# Patient Record
Sex: Female | Born: 2000 | Race: Black or African American | Hispanic: No | Marital: Single | Smoking: Never smoker
Health system: Southern US, Community
[De-identification: ages and names within clinical notes are randomized; demographics above are authoritative.]

## PROBLEM LIST (undated history)

## (undated) ENCOUNTER — Emergency Department (HOSPITAL_COMMUNITY): Admission: EM | Payer: Medicaid Other | Source: Home / Self Care

## (undated) DIAGNOSIS — Z889 Allergy status to unspecified drugs, medicaments and biological substances status: Secondary | ICD-10-CM

## (undated) DIAGNOSIS — F419 Anxiety disorder, unspecified: Secondary | ICD-10-CM

## (undated) DIAGNOSIS — F32A Depression, unspecified: Secondary | ICD-10-CM

## (undated) DIAGNOSIS — F329 Major depressive disorder, single episode, unspecified: Secondary | ICD-10-CM

## (undated) DIAGNOSIS — F99 Mental disorder, not otherwise specified: Secondary | ICD-10-CM

## (undated) HISTORY — DX: Mental disorder, not otherwise specified: F99

## (undated) HISTORY — DX: Anxiety disorder, unspecified: F41.9

## (undated) HISTORY — DX: Depression, unspecified: F32.A

## (undated) HISTORY — PX: NECK SURGERY: SHX720

## (undated) HISTORY — PX: LYMPHADENECTOMY: SHX15

---
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
## 2019-07-08 ENCOUNTER — Emergency Department (HOSPITAL_COMMUNITY)
Admission: EM | Admit: 2019-07-08 | Discharge: 2019-07-09 | Disposition: A | Discharge status: Home / Self Care | Payer: Medicaid Other | Attending: Emergency Medicine | Admitting: Emergency Medicine

## 2019-07-08 ENCOUNTER — Other Ambulatory Visit: Payer: Self-pay

## 2019-07-08 ENCOUNTER — Emergency Department (HOSPITAL_COMMUNITY): Payer: Medicaid Other

## 2019-07-08 ENCOUNTER — Encounter (HOSPITAL_COMMUNITY): Payer: Self-pay | Admitting: Student

## 2019-07-08 DIAGNOSIS — Y999 Unspecified external cause status: Secondary | ICD-10-CM | POA: Insufficient documentation

## 2019-07-08 DIAGNOSIS — Y939 Activity, unspecified: Secondary | ICD-10-CM | POA: Diagnosis not present

## 2019-07-08 DIAGNOSIS — M79674 Pain in right toe(s): Secondary | ICD-10-CM | POA: Insufficient documentation

## 2019-07-08 DIAGNOSIS — W2209XA Striking against other stationary object, initial encounter: Secondary | ICD-10-CM | POA: Diagnosis not present

## 2019-07-08 DIAGNOSIS — S99921A Unspecified injury of right foot, initial encounter: Secondary | ICD-10-CM

## 2019-07-08 DIAGNOSIS — Y929 Unspecified place or not applicable: Secondary | ICD-10-CM | POA: Insufficient documentation

## 2019-07-08 MED ORDER — NAPROXEN 500 MG PO TABS: 500.0000 mg | ORAL_TABLET | Freq: Two times a day (BID) | ORAL | 0 refills | Status: DC

## 2019-07-08 NOTE — Discharge Instructions (Addendum)
Please read and follow all provided instructions.  You have been seen today for a toe injury.   Tests performed today include: An x-ray of the affected area - does NOT show any broken bones or dislocations.  Vital signs. See below for your results today.   Home care instructions: -- *PRICE in the first 24-48 hours after injury: Protect (with brace, splint, sling), if given by your provider Rest Ice- Do not apply ice pack directly to your skin, place towel or similar between your skin and ice/ice pack. Apply ice for 20 min, then remove for 40 min while awake Compression- Wear brace, elastic bandage, splint as directed by your provider Elevate affected extremity above the level of your heart when not walking around for the first 24-48 hours   Medications:  - Naproxen is a nonsteroidal anti-inflammatory medication that will help with pain and swelling. Be sure to take this medication as prescribed with food, 1 pill every 12 hours,  It should be taken with food, as it can cause stomach upset, and more seriously, stomach bleeding. Do not take other nonsteroidal anti-inflammatory medications with this such as Advil, Motrin, Aleve, Mobic, Goodie Powder, or Motrin.    You make take Tylenol per over the counter dosing with these medications.   We have prescribed you new medication(s) today. Discuss the medications prescribed today with your pharmacist as they can have adverse effects and interactions with your other medicines including over the counter and prescribed medications. Seek medical evaluation if you start to experience new or abnormal symptoms after taking one of these medicines, seek care immediately if you start to experience difficulty breathing, feeling of your throat closing, facial swelling, or rash as these could be indications of a more serious allergic reaction   Follow-up instructions: Please follow-up with your primary care provider within 1 week for re-evaluation.   Return  instructions:  Please return if your digits or extremity are numb or tingling, appear gray or blue, or you have severe pain (also elevate the extremity and loosen splint or wrap if you were given one) Please return if you have redness or fevers.  Please return to the Emergency Department if you experience worsening symptoms.  Please return if you have any other emergent concerns. Additional Information:  Your vital signs today were: BP 96/61 (BP Location: Right Arm)    Pulse 88    Temp 99.2 F (37.3 C) (Oral)    Resp 16    LMP 07/04/2019    SpO2 100%  If your blood pressure (BP) was elevated above 135/85 this visit, please have this repeated by your doctor within one month. ---------------

## 2019-07-08 NOTE — ED Triage Notes (Signed)
Pt with 4th toe pain after stubbing it. Ambulatory with steady gait, unsure if it is swollen. NAD.

## 2019-07-08 NOTE — ED Provider Notes (Signed)
[facility] EMERGENCY DEPARTMENT Provider Note   CSN: [id number] Arrival date & time: 07/08/19  2112     History   Chief Complaint Chief Complaint  Patient presents with  . Toe Pain    HPI Latoya West is a 18 y.o. female with a hx of depression who presents to the ED w/ complaints of right 4th toe injury which occurred last night. Patient states that she stubbed the toe on a metal chair & has been having pain since. Worse with weightbearing, no alleviating factors. No intervention PTA. Denies numbness, tingling, weakness, or wounds. Denies chance of pregnancy.     HPI  Past Medical History:  Diagnosis Date  . Multiple allergies    has an epi pen for this  . Obesity, morbid [facility])     Patient Active Problem List   Diagnosis Date Noted  . Suicide attempt by drug ingestion (HCC) 07/08/2018  . 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 MDD (major depressive disorder) 07/07/2018  . Oppositional defiant disorder 11/05/2013    Past Surgical History:  Procedure Laterality Date  . LYMPHADENECTOMY    . NECK SURGERY       OB History    Gravida  0   Para  0   Term  0   Preterm  0   AB  0   Living  0     SAB  0   TAB  0   Ectopic  0   Multiple  0   Live Births  0            Home Medications    Prior to Admission medications   Medication Sig Start Date End Date Taking? Authorizing Provider  EPINEPHrine (EPIPEN 2-PAK) 0.3 mg/0.3 mL IJ SOAJ injection Inject 0.3 mg into the muscle once as needed (for anaphylactic or severe allergic reaction).    [provider]  ibuprofen (ADVIL,MOTRIN) 400 MG tablet Take 1 tablet (400 mg total) by mouth every 6 (six) hours as needed. Patient not taking: Reported on 07/06/2018 10/04/16   Everlene Farrier, PA-C  metroNIDAZOLE (FLAGYL) 500 MG tablet Take 1 tablet (500 mg total) by mouth 2 (two) times daily. 07/23/18   Leftwich-Kirby, Wilmer Floor, CNM   [facility]ico [phone] MCG/24HR transdermal patch Follow packet instructions 07/16/18   Hurshel Party, CNM    Family History No family history on file.  Social History Social History   Tobacco Use  . Smoking status: Never Smoker  . Smokeless tobacco: Never Used  Substance Use Topics  . Alcohol use: No  . Drug use: Yes    Types: Marijuana     Allergies   Kiwi extract   Review of Systems Review of Systems  Constitutional: Negative for chills and fever.  Musculoskeletal: Positive for arthralgias.  Skin: Negative for wound.  Neurological: Negative for weakness and numbness.    Physical Exam Updated Vital Signs BP 96/61 (BP Location: Right Arm)   Pulse 88   Temp 99.2 F (37.3 C) (Oral)   Resp 16   LMP 07/04/2019   SpO2 100%   Physical Exam Vitals signs and nursing note reviewed.  Constitutional:      General: She is not in acute distress.    Appearance: She is not ill-appearing or toxic-appearing.  HENT:     Head: Normocephalic and atraumatic.  Cardiovascular:     Pulses:          Dorsalis pedis pulses are 2+  on the right side and 2+ on the left side.       Posterior tibial pulses are 2+ on the right side and 2+ on the left side.  Pulmonary:     Effort: Pulmonary effort is normal.  Musculoskeletal:     Comments: Lower extremities: No obvious deformity, appreciable swelling, edema, erythema, ecchymosis, warmth, or open wounds. Patient has intact AROM to bilateral hips, knees, ankles, and all digits. Tender to palpation to the R 4th middle & distal phalanx. Otherwise nontender.   Skin:    General: Skin is warm and dry.     Capillary Refill: Capillary refill takes less than 2 seconds.  Neurological:     Mental Status: She is alert.     Comments: Alert. Clear speech. Sensation grossly intact to bilateral lower extremities. 5/5 strength with plantar/dorsiflexion bilaterally. Patient ambulatory with mildly antalgic gait, no foot drop noted.   Psychiatric:         Mood and Affect: Mood normal.        Behavior: Behavior normal.    ED Treatments / Results  Labs (all labs ordered are listed, but only abnormal results are displayed) Labs Reviewed - No data to display  EKG None  Radiology Dg Foot Complete Right  Result Date: 07/08/2019 CLINICAL DATA:  Right fourth toe pain after stubbing toe last night. Persistent pain. EXAM: RIGHT FOOT COMPLETE - 3+ VIEW COMPARISON:  None. FINDINGS: There is no evidence of fracture or dislocation. There is no evidence of arthropathy or other focal bone abnormality. Soft tissues are unremarkable. IMPRESSION: Negative radiographs of the right foot. Electronically Signed   By: Keith Rake M.D.   On: 07/08/2019 21:52    Procedures Procedures (including critical care time)  Medications Ordered in ED Medications - No data to display   Initial Impression / Assessment and Plan / ED Course  I have reviewed the triage vital signs and the nursing notes.  Pertinent labs & imaging results that were available during my care of the patient were reviewed by me and considered in my medical decision making (see chart for details).    Patient presents to the ED with complaints of pain to the  R 4th toe pain s/p injury last night. Exam without obvious deformity or open wounds. ROM intact. Tender to palpation to middle & distal phalanx. NVI distally. Xray negative for fracture/dislocation. Buddy taped. PRICE recommended. Naproxen prescribed. I discussed results, treatment plan, need for follow-up, and return precautions with the patient. Provided opportunity for questions, patient confirmed understanding and are in agreement with plan.     Final Clinical Impressions(s) / ED Diagnoses   Final diagnoses:  Injury of toe on right foot, initial encounter    ED Discharge Orders         Ordered    naproxen (NAPROSYN) 500 MG tablet  2 times daily     07/08/19 2321           Amaryllis Dyke, PA-C 07/08/19 2330     Little, Wenda Overland, MD 07/10/19 [phone]

## 2019-11-02 ENCOUNTER — Inpatient Hospital Stay (HOSPITAL_COMMUNITY)
Admission: AD | Admit: 2019-11-02 | Discharge: 2019-11-02 | Disposition: A | Discharge status: Home / Self Care | Payer: Medicaid Other | Attending: Obstetrics & Gynecology | Admitting: Obstetrics & Gynecology

## 2019-11-02 ENCOUNTER — Encounter (HOSPITAL_COMMUNITY): Payer: Self-pay | Admitting: Obstetrics & Gynecology

## 2019-11-02 ENCOUNTER — Other Ambulatory Visit: Payer: Self-pay

## 2019-11-02 DIAGNOSIS — O211 Hyperemesis gravidarum with metabolic disturbance: Secondary | ICD-10-CM | POA: Insufficient documentation

## 2019-11-02 DIAGNOSIS — Z3A01 Less than 8 weeks gestation of pregnancy: Secondary | ICD-10-CM | POA: Insufficient documentation

## 2019-11-02 DIAGNOSIS — O219 Vomiting of pregnancy, unspecified: Secondary | ICD-10-CM

## 2019-11-02 DIAGNOSIS — O99211 Obesity complicating pregnancy, first trimester: Secondary | ICD-10-CM | POA: Diagnosis not present

## 2019-11-02 DIAGNOSIS — O210 Mild hyperemesis gravidarum: Secondary | ICD-10-CM | POA: Diagnosis present

## 2019-11-02 LAB — CBC
HCT: 38.2 % (ref 36.0–46.0)
Hemoglobin: 12.6 g/dL (ref 12.0–15.0)
MCH: 28.7 pg (ref 26.0–34.0)
MCHC: 33 g/dL (ref 30.0–36.0)
MCV: 87 fL (ref 80.0–100.0)
Platelets: 264 10*3/uL (ref 150–400)
RBC: 4.39 MIL/uL (ref 3.87–5.11)
RDW: 13 % (ref 11.5–15.5)
WBC: 8.1 10*3/uL (ref 4.0–10.5)
nRBC: 0 % (ref 0.0–0.2)

## 2019-11-02 LAB — URINALYSIS, ROUTINE W REFLEX MICROSCOPIC
Bilirubin Urine: NEGATIVE
Glucose, UA: NEGATIVE mg/dL
Hgb urine dipstick: NEGATIVE
Ketones, ur: 5 mg/dL — AB
Nitrite: NEGATIVE
Protein, ur: 30 mg/dL — AB
Specific Gravity, Urine: 1.031 — ABNORMAL HIGH (ref 1.005–1.030)
pH: 5 (ref 5.0–8.0)

## 2019-11-02 LAB — POC URINE PREG, ED: Preg Test, Ur: POSITIVE — AB

## 2019-11-02 LAB — BASIC METABOLIC PANEL
Anion gap: 9 (ref 5–15)
BUN: 8 mg/dL (ref 6–20)
CO2: 23 mmol/L (ref 22–32)
Calcium: 9.1 mg/dL (ref 8.9–10.3)
Chloride: 101 mmol/L (ref 98–111)
Creatinine, Ser: 0.76 mg/dL (ref 0.44–1.00)
GFR calc Af Amer: >60 mL/min (ref >60)
GFR calc non Af Amer: >60 mL/min (ref >60)
Glucose, Bld: 85 mg/dL (ref 70–99)
Potassium: 4 mmol/L (ref 3.5–5.1)
Sodium: 133 mmol/L — ABNORMAL LOW (ref 135–145)

## 2019-11-02 MED ORDER — FAMOTIDINE IN NACL 20-0.9 MG/50ML-% IV SOLN
20.0000 mg | Freq: Once | INTRAVENOUS | Status: AC
  Administered 2019-11-02: 20 mg via INTRAVENOUS
  Filled 2019-11-02: qty 50

## 2019-11-02 MED ORDER — PYRIDOXINE HCL 25 MG PO TABS: 25.0000 mg | ORAL_TABLET | Freq: Three times a day (TID) | ORAL | 0 refills | Status: DC

## 2019-11-02 MED ORDER — PROMETHAZINE HCL 25 MG/ML IJ SOLN
25.0000 mg | Freq: Four times a day (QID) | INTRAMUSCULAR | Status: DC | PRN
  Administered 2019-11-02: 25 mg via INTRAVENOUS
  Filled 2019-11-02: qty 1

## 2019-11-02 MED ORDER — PROMETHAZINE HCL 25 MG PO TABS: 25.0000 mg | ORAL_TABLET | Freq: Four times a day (QID) | ORAL | 0 refills | Status: DC | PRN

## 2019-11-02 MED ORDER — DOXYLAMINE SUCCINATE (SLEEP) 25 MG PO TABS: 25.0000 mg | ORAL_TABLET | Freq: Three times a day (TID) | ORAL | 0 refills | Status: DC | PRN

## 2019-11-02 MED ORDER — LACTATED RINGERS IV BOLUS
1000.0000 mL | Freq: Once | INTRAVENOUS | Status: AC
  Administered 2019-11-02: 20:00:00 1000 mL via INTRAVENOUS

## 2019-11-02 NOTE — Discharge Instructions (Signed)
Morning Sickness ° °Morning sickness is when you feel sick to your stomach (nauseous) during pregnancy. You may feel sick to your stomach and throw up (vomit). You may feel sick in the morning, but you can feel this way at any time of day. Some women feel very sick to their stomach and cannot stop throwing up (hyperemesis gravidarum). °Follow these instructions at home: °Medicines °· Take over-the-counter and prescription medicines only as told by your doctor. Do not take any medicines until you talk with your doctor about them first. °· Taking multivitamins before getting pregnant can stop or lessen the harshness of morning sickness. °Eating and drinking °· Eat dry toast or crackers before getting out of bed. °· Eat 5 or 6 small meals a day. °· Eat dry and bland foods like rice and baked potatoes. °· Do not eat greasy, fatty, or spicy foods. °· Have someone cook for you if the smell of food causes you to feel sick or throw up. °· If you feel sick to your stomach after taking prenatal vitamins, take them at night or with a snack. °· Eat protein when you need a snack. Nuts, yogurt, and cheese are good choices. °· Drink fluids throughout the day. °· Try ginger ale made with real ginger, ginger tea made from fresh grated ginger, or ginger candies. °General instructions °· Do not use any products that have nicotine or tobacco in them, such as cigarettes and e-cigarettes. If you need help quitting, ask your doctor. °· Use an air purifier to keep the air in your house free of smells. °· Get lots of fresh air. °· Try to avoid smells that make you feel sick. °· Try: °? Wearing a bracelet that is used for seasickness (acupressure wristband). °? Going to a doctor who puts thin needles into certain body points (acupuncture) to improve how you feel. °Contact a doctor if: °· You need medicine to feel better. °· You feel dizzy or light-headed. °· You are losing weight. °Get help right away if: °· You feel very sick to your  stomach and cannot stop throwing up. °· You pass out (faint). °· You have very bad pain in your belly. °Summary °· Morning sickness is when you feel sick to your stomach (nauseous) during pregnancy. °· You may feel sick in the morning, but you can feel this way at any time of day. °· Making some changes to what you eat may help your symptoms go away. °This information is not intended to replace advice given to you by your health care provider. Make sure you discuss any questions you have with your health care provider. °Document Revised: 07/26/2017 Document Reviewed: 09/13/2016 °Elsevier Patient Education © 2020 [employer] ° °

## 2019-11-02 NOTE — ED Provider Notes (Addendum)
Patient placed in Quick Look pathway, seen and evaluated   Chief Complaint: Abdominal cramping, nausea, vomiting  HPI:   Patient presenting for evaluation of 1 week history of nausea and vomiting as well as lower abdominal cramping which she reports is similar to menstrual cramps.  She took a pregnancy test a few days ago which was positive.  Denies fever, chest pain, shortness of breath, diarrhea, constipation, vaginal itching, bleeding, or discharge.    ROS: +n/v, abdominal pain, -ve fever, chest pain, shortness of breath  Physical Exam:   Gen: No distress  Neuro: Awake and Alert  Skin: Warm    Focused Exam: Abdomen soft, nontender, active bowel sounds.  Lungs clear to auscultation bilaterally.  Heart regular rate and rhythm.  Spoke with Melanie at the MAU, plan to transfer to [facility] hospital for further evaluation and management.  Initiation of care has begun. The patient has been counseled on the process, plan, and necessity for staying for the completion/evaluation, and the remainder of the medical screening examination     Bennye Alm 11/02/19 1751    Maia Plan, MD 11/03/19 1124

## 2019-11-02 NOTE — MAU Provider Note (Signed)
  History     CSN: [id number]  Arrival date and time: 11/02/19 1645   First Provider Initiated Contact with Patient 11/02/19 1847      Chief Complaint  Patient presents with  . Morning Sickness   Latoya West is a 19 year old G1P0 at [redacted]w[redacted]d gestation by LMP who presents for one week of nausea and vomiting. Patient has felt nauseous for over a week by smells and foods. Patient is able to tolerate small amounts of food and liquids by mouth, but throws up if consumes more. Patient endorses a 10 lbs weight loss and "feels dehydrated" with little energy and a "spinny" feeling on standing. Nausea and vomiting is alleviated by sleep and ginger ale. Patient denies fever, diarrhea, sick contacts.    OB History    Gravida  1   Para  0   Term  0   Preterm  0   AB  0   Living  0     SAB  0   TAB  0   Ectopic  0   Multiple  0   Live Births  0           Past Medical History:  Diagnosis Date  . Multiple allergies    has an epi pen for this  . Obesity, morbid [facility])     Past Surgical History:  Procedure Laterality Date  . LYMPHADENECTOMY    . NECK SURGERY      History reviewed. No pertinent family history.  Social History   Tobacco Use  . Smoking status: Never Smoker  . Smokeless tobacco: Never Used  Substance Use Topics  . Alcohol use: No  . Drug use: Yes    Types: Marijuana    Allergies:  Allergies  Allergen Reactions  . Kiwi Extract Swelling and Rash    Medications Prior to Admission  Medication Sig Dispense Refill Last Dose  . EPINEPHrine (EPIPEN 2-PAK) 0.3 mg/0.3 mL IJ SOAJ injection Inject 0.3 mg into the muscle once as needed (for anaphylactic or severe allergic reaction).     . metroNIDAZOLE (FLAGYL) 500 MG tablet Take 1 tablet (500 mg total) by mouth 2 (two) times daily. 14 tablet 0   . naproxen (NAPROSYN) 500 MG tablet Take 1 tablet (500 mg total) by mouth 2 (two) times daily. 10 tablet 0   . XULANE 150-35 MCG/24HR transdermal patch Follow packet  instructions 3 patch 11     Review of Systems  Constitutional: Positive for appetite change, fatigue and unexpected weight change. Negative for fever.  Gastrointestinal: Positive for nausea and vomiting. Negative for abdominal pain, constipation and diarrhea.  Genitourinary: Negative for vaginal bleeding.   Physical Exam   Blood pressure 115/74, pulse 77, temperature 98.7 F (37.1 C), resp. rate 16, height 5\' 6"  (1.676 m), weight 97.5 kg, last menstrual period 09/19/2019, SpO2 100 %.  Physical Exam  Constitutional: She is oriented to person, place, and time. She appears well-developed and well-nourished.  HENT:  Head: Normocephalic.  Neurological: She is alert and oriented to person, place, and time.    MAU Course  Procedures  Urinalysis showed elevated specific gravity indicating dehydration. Promethazine ordered for nausea/vomiting and IV fluids for dehydration. CBC for WBCs to rule out infection. BMP for electrolyte status.  Assessment and Plan    09/21/2019 11/02/2019, 7:30 PM

## 2019-11-02 NOTE — MAU Provider Note (Addendum)
History     CSN: [id number]  Arrival date and time: 11/02/19 1645  19 y.o. G1 @[redacted]w[redacted]d  by LMP presenting with N/V. Sx started 1 week ago. She can tolerate very little. She was able to drink ginger ale today. Denies sick contacts. No fevers. Denies abd pain and VB.  OB History    Gravida  1   Para  0   Term  0   Preterm  0   AB  0   Living  0     SAB  0   TAB  0   Ectopic  0   Multiple  0   Live Births  0           Past Medical History:  Diagnosis Date  . Multiple allergies    has an epi pen for this  . Obesity, morbid [facility])     Past Surgical History:  Procedure Laterality Date  . LYMPHADENECTOMY    . NECK SURGERY      No family history on file.  Social History   Tobacco Use  . Smoking status: Never Smoker  . Smokeless tobacco: Never Used  Substance Use Topics  . Alcohol use: No  . Drug use: Yes    Types: Marijuana    Allergies:  Allergies  Allergen Reactions  . Kiwi Extract Swelling and Rash    Medications Prior to Admission  Medication Sig Dispense Refill Last Dose  . EPINEPHrine (EPIPEN 2-PAK) 0.3 mg/0.3 mL IJ SOAJ injection Inject 0.3 mg into the muscle once as needed (for anaphylactic or severe allergic reaction).     . metroNIDAZOLE (FLAGYL) 500 MG tablet Take 1 tablet (500 mg total) by mouth 2 (two) times daily. 14 tablet 0   . naproxen (NAPROSYN) 500 MG tablet Take 1 tablet (500 mg total) by mouth 2 (two) times daily. 10 tablet 0   . XULANE 150-35 MCG/24HR transdermal patch Follow packet instructions 3 patch 11     Review of Systems  Constitutional: Negative for chills and fever.  Gastrointestinal: Positive for nausea and vomiting. Negative for abdominal pain.  Genitourinary: Negative for vaginal bleeding.   Physical Exam   Blood pressure 118/89, pulse 92, temperature 99 F (37.2 C), temperature source Oral, resp. rate 16, last menstrual period 07/04/2019, SpO2 100 %.  Physical Exam  Nursing note and vitals  reviewed. Constitutional: She is oriented to person, place, and time. She appears well-developed and well-nourished. No distress.  HENT:  Head: Normocephalic and atraumatic.  Cardiovascular: Normal rate.  Respiratory: Effort normal. No respiratory distress.  Musculoskeletal:        General: Normal range of motion.     Cervical back: Normal range of motion.  Neurological: She is alert and oriented to person, place, and time.  Skin: Skin is warm and dry.  Psychiatric: She has a normal mood and affect.   Results for orders placed or performed during the hospital encounter of 11/02/19 (from the past 24 hour(s))  POC Urine Pregnancy, ED (not at [facility])     Status: Abnormal   Collection Time: 11/02/19  5:31 PM  Result Value Ref Range   Preg Test, Ur POSITIVE (A) NEGATIVE  Urinalysis, Routine w reflex microscopic     Status: Abnormal   Collection Time: 11/02/19  6:31 PM  Result Value Ref Range   Color, Urine YELLOW YELLOW   APPearance HAZY (A) CLEAR   Specific Gravity, Urine 1.031 (H) 1.005 - 1.030   pH 5.0 5.0 - 8.0  Glucose, UA NEGATIVE NEGATIVE mg/dL   Hgb urine dipstick NEGATIVE NEGATIVE   Bilirubin Urine NEGATIVE NEGATIVE   Ketones, ur 5 (A) NEGATIVE mg/dL   Protein, ur 30 (A) NEGATIVE mg/dL   Nitrite NEGATIVE NEGATIVE   Leukocytes,Ua TRACE (A) NEGATIVE   RBC / HPF 0-5 0 - 5 RBC/hpf   WBC, UA 0-5 0 - 5 WBC/hpf   Bacteria, UA RARE (A) NONE SEEN   Squamous Epithelial / LPF 6-10 0 - 5   Mucus PRESENT   CBC     Status: None   Collection Time: 11/02/19  7:36 PM  Result Value Ref Range   WBC 8.1 4.0 - 10.5 K/uL   RBC 4.39 3.87 - 5.11 MIL/uL   Hemoglobin 12.6 12.0 - 15.0 g/dL   HCT 47.4 25.9 - 56.3 %   MCV 87.0 80.0 - 100.0 fL   MCH 28.7 26.0 - 34.0 pg   MCHC 33.0 30.0 - 36.0 g/dL   RDW 87.5 64.3 - 32.9 %   Platelets 264 150 - 400 K/uL   nRBC 0.0 0.0 - 0.2 %  Basic metabolic panel     Status: Abnormal   Collection Time: 11/02/19  7:36 PM  Result Value Ref Range   Sodium 133  (L) 135 - 145 mmol/L   Potassium 4.0 3.5 - 5.1 mmol/L   Chloride 101 98 - 111 mmol/L   CO2 23 22 - 32 mmol/L   Glucose, Bld 85 70 - 99 mg/dL   BUN 8 6 - 20 mg/dL   Creatinine, Ser 5.18 0.44 - 1.00 mg/dL   Calcium 9.1 8.9 - 84.1 mg/dL   GFR calc non Af Amer >60 >60 mL/min   GFR calc Af Amer >60 >60 mL/min   Anion gap 9 5 - 15   MAU Course  Procedures Meds ordered this encounter  Medications  . lactated ringers bolus 1,000 mL  . promethazine (PHENERGAN) injection 25 mg  . famotidine (PEPCID) IVPB 20 mg premix  . pyridOXINE (VITAMIN B-6) 25 MG tablet    Sig: Take 1 tablet (25 mg total) by mouth every 8 (eight) hours.    Dispense:  30 tablet    Refill:  0    Order Specific Question:   Supervising Provider    Answer:   Despina Hidden, LUTHER H [[id number]]  . doxylamine, Sleep, (UNISOM) 25 MG tablet    Sig: Take 1 tablet (25 mg total) by mouth every 8 (eight) hours as needed.    Dispense:  30 tablet    Refill:  0    Order Specific Question:   Supervising Provider    Answer:   Despina Hidden, LUTHER H [[id number]]  . promethazine (PHENERGAN) 25 MG tablet    Sig: Take 1 tablet (25 mg total) by mouth every 6 (six) hours as needed for nausea or vomiting. For nausea and vomiting not relieved by B6 and Unisom    Dispense:  30 tablet    Refill:  0    Order Specific Question:   Supervising Provider    Answer:   Lazaro Arms [[id number]]   MDM Labs ordered. Transfer of care given to Dorna Bloom, [place]  11/02/2019 8:32 PM   --Patient tolerating PO after completion of IV medications. --Discussed diet change as primary intervention, B6+ Unisom as secondary, Phenergan for breakthrough vomiting Patient Vitals for the past 24 hrs:  BP Temp Temp src Pulse Resp SpO2 Height Weight  11/02/19 2143 110/61 -- -- 80 -- -- -- --  11/02/19 1841 115/74 98.7 F (37.1 C) -- 77 16 100 % 5\' 6"  (1.676 m) 97.5 kg  11/02/19 1721 118/89 99 F (37.2 C) Oral 92 16 100 % -- --   Assessment and Plan  --19 y.o. G1P0000 at  [redacted]w[redacted]d  --Nausea and vomiting in first trimester --Tolerating PO prior to discharge --Discharge home in stable condition  [redacted]w[redacted]d, MSN, CNM Certified Nurse Midwife, Faculty Practice 11/02/19 9:52 PM

## 2019-11-02 NOTE — MAU Note (Signed)
.   Latoya West is a 19 y.o. at [redacted]w[redacted]d here in MAU reporting: nausea and vomiting for a week and states she is not able to keep anything down. Denies pain or VB LMP: 09/19/19 Onset of complaint: ongoing for a week Pain score: 0 Vitals:   11/02/19 1721 11/02/19 1841  BP: 118/89 115/74  Pulse: 92 77  Resp: 16 16  Temp: 99 F (37.2 C) 98.7 F (37.1 C)  SpO2: 100% 100%     FHT Lab orders placed from triage: UA

## 2019-11-18 ENCOUNTER — Encounter: Payer: Self-pay | Admitting: General Practice

## 2019-12-09 ENCOUNTER — Ambulatory Visit (INDEPENDENT_AMBULATORY_CARE_PROVIDER_SITE_OTHER): Payer: Medicaid Other

## 2019-12-09 DIAGNOSIS — Z3401 Encounter for supervision of normal first pregnancy, first trimester: Secondary | ICD-10-CM

## 2019-12-09 NOTE — Progress Notes (Signed)
Virtual Visit via Video Note  I connected with Latoya West on 12/09/19 at  2:00 PM EDT by a video enabled telemedicine application and verified that I am speaking with the correct person using two identifiers.  I discussed the limitations of evaluation and management by telemedicine and the availability of in person appointments. The patient expressed understanding and agreed to proceed.  History of Present Illness:    OB History  Gravida Para Term Preterm AB Living  1 0 0 0 0 0  SAB TAB Ectopic Multiple Live Births  0 0 0 0 0    # Outcome Date GA Lbr Len/2nd Weight Sex Delivery Anes PTL Lv  1 Current             Assessment and Plan:  Normal Pregnancy   Follow Up Instructions:  12/21/19    I discussed the assessment and treatment plan with the patient. The patient was provided an opportunity to ask questions and all were answered. The patient agreed with the plan and demonstrated an understanding of the instructions.   The patient was advised to call back or seek an in-person evaluation if the symptoms worsen or if the condition fails to improve as anticipated.  I provided 10 minutes of non-face-to-face time during this encounter.   Thom Chimes, CMA

## 2019-12-10 NOTE — Progress Notes (Signed)
Patient ID: Latoya West, female   DOB: 05/14/2001, 19 y.o.   MRN: [id number] Patient seen and assessed by nursing staff during this encounter. I have reviewed the chart and agree with the documentation and plan. I have also made any necessary editorial changes.  Scheryl Darter, MD 12/10/2019 9:25 AM

## 2019-12-21 ENCOUNTER — Other Ambulatory Visit (HOSPITAL_COMMUNITY)
Admission: RE | Admit: 2019-12-21 | Discharge: 2019-12-21 | Disposition: A | Discharge status: Home / Self Care | Payer: Medicaid Other | Source: Ambulatory Visit | Attending: Advanced Practice Midwife | Admitting: Advanced Practice Midwife

## 2019-12-21 ENCOUNTER — Encounter: Payer: Self-pay | Admitting: Advanced Practice Midwife

## 2019-12-21 ENCOUNTER — Other Ambulatory Visit: Payer: Self-pay

## 2019-12-21 ENCOUNTER — Ambulatory Visit (INDEPENDENT_AMBULATORY_CARE_PROVIDER_SITE_OTHER): Payer: Medicaid Other | Admitting: Advanced Practice Midwife

## 2019-12-21 VITALS — BP 104/71 | HR 67 | Wt 220.8 lb

## 2019-12-21 DIAGNOSIS — O219 Vomiting of pregnancy, unspecified: Secondary | ICD-10-CM

## 2019-12-21 DIAGNOSIS — Z3402 Encounter for supervision of normal first pregnancy, second trimester: Secondary | ICD-10-CM | POA: Diagnosis not present

## 2019-12-21 DIAGNOSIS — Z3401 Encounter for supervision of normal first pregnancy, first trimester: Secondary | ICD-10-CM

## 2019-12-21 DIAGNOSIS — Z3A13 13 weeks gestation of pregnancy: Secondary | ICD-10-CM

## 2019-12-21 DIAGNOSIS — Z6841 Body Mass Index (BMI) 40.0 and over, adult: Secondary | ICD-10-CM | POA: Insufficient documentation

## 2019-12-21 DIAGNOSIS — O99211 Obesity complicating pregnancy, first trimester: Secondary | ICD-10-CM

## 2019-12-21 DIAGNOSIS — Z8659 Personal history of other mental and behavioral disorders: Secondary | ICD-10-CM

## 2019-12-21 MED ORDER — ASPIRIN EC 81 MG PO TBEC: 81.0000 mg | DELAYED_RELEASE_TABLET | Freq: Every day | ORAL | 5 refills | Status: DC

## 2019-12-21 NOTE — Progress Notes (Addendum)
Subjective:   Latoya West is a 19 y.o. G1P0000 at 43w2dby LMP being seen today for her first obstetrical visit.  Her obstetrical history is significant for obesity, Chlamydia in 2019, and has Oppositional defiant disorder; MDD (major depressive disorder); Suicide attempt by drug ingestion ([facility]; MDD (major depressive disorder), recurrent severe, without psychosis ([place]; Encounter for supervision of normal first pregnancy in first trimester; and Obesity affecting pregnancy in first trimester on their problem list.. Patient does intend to breast feed. Pregnancy history fully reviewed.  Patient reports no complaints. Denies Vaginal bleeding, contractions, LOF, nausea and vomiting today.   HISTORY: OB History  Gravida Para Term Preterm AB Living  1 0 0 0 0 0  SAB TAB Ectopic Multiple Live Births  0 0 0 0 0    # Outcome Date GA Lbr Len/2nd Weight Sex Delivery Anes PTL Lv  1 Current            Past Medical History:  Diagnosis Date  . Mental disorder   . Multiple allergies    has an epi pen for this  . Obesity, morbid ([facility]    Past Surgical History:  Procedure Laterality Date  . LYMPHADENECTOMY    . NECK SURGERY     Family History  Problem Relation Age of Onset  . Arthritis Mother    Social History   Tobacco Use  . Smoking status: Never Smoker  . Smokeless tobacco: Never Used  Substance Use Topics  . Alcohol use: No  . Drug use: Yes    Types: Marijuana   Allergies  Allergen Reactions  . Kiwi Extract Swelling and Rash   Current Outpatient Medications on File Prior to Visit  Medication Sig Dispense Refill  . Prenatal Vit-Fe Fumarate-FA (MULTIVITAMIN-PRENATAL) 27-0.8 MG TABS tablet Take 1 tablet by mouth daily at 12 noon.    .Marland Kitchendoxylamine, Sleep, (UNISOM) 25 MG tablet Take 1 tablet (25 mg total) by mouth every 8 (eight) hours as needed. (Patient not taking: Reported on 12/21/2019) 30 tablet 0  . EPINEPHrine (EPIPEN 2-PAK) 0.3 mg/0.3 mL IJ SOAJ injection Inject 0.3  mg into the muscle once as needed (for anaphylactic or severe allergic reaction).    . promethazine (PHENERGAN) 25 MG tablet Take 1 tablet (25 mg total) by mouth every 6 (six) hours as needed for nausea or vomiting. For nausea and vomiting not relieved by B6 and Unisom (Patient not taking: Reported on 12/21/2019) 30 tablet 0  . pyridOXINE (VITAMIN B-6) 25 MG tablet Take 1 tablet (25 mg total) by mouth every 8 (eight) hours. (Patient not taking: Reported on 12/21/2019) 30 tablet 0   No current facility-administered medications on file prior to visit.    Indications for ASA therapy (per uptodate) One of the following: Previous pregnancy with preeclampsia, especially early onset and with an adverse outcome No Multifetal gestation No Chronic hypertension No Type 1 or 2 diabetes mellitus No Chronic kidney disease No Autoimmune disease (antiphospholipid syndrome, systemic lupus erythematosus) No  Two or more of the following: Nulliparity Yes Obesity (body mass index >30 kg/m2) Yes Family history of preeclampsia in mother or sister No Age ?35 years No Sociodemographic characteristics (African American race, low socioeconomic level) Yes Personal risk factors (eg, previous pregnancy with low birth weight or small for gestational age infant, previous adverse pregnancy outcome [eg, stillbirth], interval >10 years between pregnancies) No  Indications for early 1 hour GTT (per uptodate)  BMI >25 (>23 in Asian women) AND one of  the following  Gestational diabetes mellitus in a previous pregnancy No Glycated hemoglobin ?5.7 percent (39 mmol/mol), impaired glucose tolerance, or impaired fasting glucose on previous testing No First-degree relative with diabetes No High-risk race/ethnicity (eg, African American, Latino, Native American, [place] American, Pacific Islander) Yes History of cardiovascular disease No Hypertension or on therapy for hypertension No High-density lipoprotein cholesterol level <35  mg/dL (0.90 mmol/L) and/or a triglyceride level >250 mg/dL (2.82 mmol/L) No Polycystic ovary syndrome No Physical inactivity Yes Other clinical condition associated with insulin resistance (eg, severe obesity, acanthosis nigricans) No Previous birth of an infant weighing ?4000 g No Previous stillbirth of unknown cause No Exam   Vitals:   12/21/19 1020  BP: 104/71  Pulse: 67  Weight: 220 lb 12.8 oz (100.2 kg)   Fetal Heart Rate (bpm): 140  Uterus:     Pelvic Exam: Perineum: no hemorrhoids, normal perineum   Vulva: normal external genitalia, no lesions   Vagina:  normal mucosa, normal discharge   Cervix: no lesions and normal, pap smear done.    Adnexa: normal adnexa and no mass, fullness, tenderness   Bony Pelvis: average  System: General: well-developed, well-nourished female in no acute distress   Breast:  Deferred    Skin: normal coloration and turgor, no rashes   Neurologic: oriented, normal, negative, normal mood   Extremities: normal strength, tone, and muscle mass, ROM of all joints is normal   HEENT PERRLA, extraocular movement intact and sclera clear, anicteric   Mouth/Teeth mucous membranes moist, pharynx normal without lesions and dental hygiene good   Neck supple and no masses   Cardiovascular: regular rate and rhythm   Respiratory:  no respiratory distress, normal breath sounds   Abdomen: soft, non-tender; bowel sounds normal; no masses,  no organomegaly     Assessment:   Pregnancy: G1P0000 Patient Active Problem List   Diagnosis Date Noted  . Obesity affecting pregnancy in first trimester 12/21/2019  . Encounter for supervision of normal first pregnancy in first trimester 12/09/2019  . Suicide attempt by drug ingestion ([place]) 07/08/2018  . MDD (major depressive disorder), recurrent severe, without psychosis ([place]) 07/08/2018  . MDD (major depressive disorder) 07/07/2018  . Oppositional defiant disorder 11/05/2013     Plan:  1. Encounter for supervision of  normal first pregnancy in first trimester - FHT was normal - Pt is excited about pregnancy ("I can't stop smiling after hearing the heartbeat") - Pt reports feeling safe at home and having some support (support person  = mom)    2. Obesity affecting pregnancy in first trimester - Discussed with Pt appropriate wt gain during pregnancy.  - A1C tested - Rx for ASA, start today  3. History of depression - Pt seen by Seth Bake with [facility]  Initial labs drawn. Continue prenatal vitamins. Discussed and offered genetic screening options, including Quad screen/AFP, NIPS testing, and option to decline testing. Benefits/risks/alternatives reviewed. Pt aware that anatomy US is form of genetic screening with lower accuracy in detecting trisomies than blood work.  Pt chooses/declines genetic screening today. declines: declined. Ultrasound discussed; fetal anatomic survey: deferred. Problem list reviewed and updated. The nature of [place] with multiple MDs and other Advanced Practice Providers was explained to patient; also emphasized that residents, students are part of our team. Routine obstetric precautions reviewed. No follow-ups on file.   Shawna Orleans, MS3 12/21/19 11:45 AM   CNM attestation:  I have seen and examined this patient and was present for  all history taking and examination.  I agree with above documentation in the medical student's note.   Latoya West is a 19 y.o. G1P0000 in the [place] office for initial prenatal visit for low risk pregnancy. See problem list below. +FM, denies LOF, VB, contractions, vaginal discharge.  Patient Active Problem List   Diagnosis Date Noted  . Obesity affecting pregnancy in first trimester 12/21/2019  . Encounter for supervision of normal first pregnancy in first trimester 12/09/2019  . Suicide attempt by drug ingestion ([place]) 07/08/2018  . MDD (major depressive disorder), recurrent severe,  without psychosis ([place]) 07/08/2018  . MDD (major depressive disorder) 07/07/2018  . Oppositional defiant disorder 11/05/2013     ROS, labs, PMH reviewed  PE: BP 104/71   Pulse 67   Wt 220 lb 12.8 oz (100.2 kg)   LMP 09/19/2019   BMI 35.64 kg/m  Gen: calm comfortable, well appearing Resp: normal effort, no distress Abd: gravid appropriate for gestational age  Fundal height: FHT by doppler:  Assessment/Plan: -    1. Encounter for supervision of normal first pregnancy in first trimester --Anticipatory guidance about next visits/weeks of pregnancy given. --Next visit in 4 weeks in office. Will discuss genetic screening again at that time as pt is unsure, declines today.    - Obstetric Panel, Including HIV - Culture, OB Urine - Hepatitis C Antibody - Babyscripts Schedule Optimization - Hemoglobin A1c - Cervicovaginal ancillary only( [place]) - [place] MFM OB COMP + 14 WK; Future  2. Obesity affecting pregnancy in first trimester --BMI 34 so may not need serial [place] for growth, will consider if unable to palpate fundal height appropriately.  Discussed weight gain recommendations. - aspirin EC 81 MG tablet; Take 1 tablet (81 mg total) by mouth daily.  Dispense: 30 tablet; Refill: 5 - Referral to Nutrition and Diabetes Services - Comp Met (CMET) - Protein / creatinine ratio, urine  3. History of depression --Seth Bake saw pt in office today and will follow up.  Pt doing well and bonding appropriately with pregnancy.  - Ambulatory referral to [place]   4. Nausea and vomiting during pregnancy prior to [redacted] weeks gestation --Improved now, still has Phenergan Rx for PRN.  Fatima Blank, CNM 12:01 PM

## 2019-12-21 NOTE — Progress Notes (Signed)
NOB in office, reports some pressure. NOB intake completed on 12-09-19. Declined genetic testing.

## 2019-12-22 ENCOUNTER — Other Ambulatory Visit: Payer: Self-pay

## 2019-12-22 LAB — OBSTETRIC PANEL, INCLUDING HIV
Antibody Screen: NEGATIVE
Basophils Absolute: 0 10*3/uL (ref 0.0–0.2)
Basos: 0 %
EOS (ABSOLUTE): 0.1 10*3/uL (ref 0.0–0.4)
Eos: 1 %
HIV Screen 4th Generation wRfx: NONREACTIVE
Hematocrit: 34.8 % (ref 34.0–46.6)
Hemoglobin: 11.7 g/dL (ref 11.1–15.9)
Hepatitis B Surface Ag: NEGATIVE
Immature Grans (Abs): 0 10*3/uL (ref 0.0–0.1)
Immature Granulocytes: 0 %
Lymphocytes Absolute: 2.9 10*3/uL (ref 0.7–3.1)
Lymphs: 36 %
MCH: 28.8 pg (ref 26.6–33.0)
MCHC: 33.6 g/dL (ref 31.5–35.7)
MCV: 86 fL (ref 79–97)
Monocytes Absolute: 0.4 10*3/uL (ref 0.1–0.9)
Monocytes: 6 %
Neutrophils Absolute: 4.6 10*3/uL (ref 1.4–7.0)
Neutrophils: 57 %
Platelets: 225 10*3/uL (ref 150–450)
RBC: 4.06 x10E6/uL (ref 3.77–5.28)
RDW: 12.2 % (ref 11.7–15.4)
RPR Ser Ql: NONREACTIVE
Rh Factor: POSITIVE
Rubella Antibodies, IGG: 2.73 index (ref >0.99)
WBC: 8 10*3/uL (ref 3.4–10.8)

## 2019-12-22 LAB — HEPATITIS C ANTIBODY: Hep C Virus Ab: <0.1 s/co ratio (ref 0.0–0.9)

## 2019-12-22 LAB — COMPREHENSIVE METABOLIC PANEL
ALT: 8 IU/L (ref 0–32)
AST: 12 IU/L (ref 0–40)
Albumin/Globulin Ratio: 1.6 (ref 1.2–2.2)
Albumin: 4.1 g/dL (ref 3.9–5.0)
Alkaline Phosphatase: 44 IU/L (ref 39–117)
BUN/Creatinine Ratio: 12 (ref 9–23)
BUN: 7 mg/dL (ref 6–20)
Bilirubin Total: <0.2 mg/dL (ref 0.0–1.2)
CO2: 21 mmol/L (ref 20–29)
Calcium: 9 mg/dL (ref 8.7–10.2)
Chloride: 102 mmol/L (ref 96–106)
Creatinine, Ser: 0.58 mg/dL (ref 0.57–1.00)
GFR calc Af Amer: 155 mL/min/{1.73_m2} (ref >59)
GFR calc non Af Amer: 134 mL/min/{1.73_m2} (ref >59)
Globulin, Total: 2.6 g/dL (ref 1.5–4.5)
Glucose: 73 mg/dL (ref 65–99)
Potassium: 3.8 mmol/L (ref 3.5–5.2)
Sodium: 135 mmol/L (ref 134–144)
Total Protein: 6.7 g/dL (ref 6.0–8.5)

## 2019-12-22 LAB — CERVICOVAGINAL ANCILLARY ONLY
Chlamydia: POSITIVE — AB
Comment: NEGATIVE
Comment: NEGATIVE
Comment: NORMAL
Neisseria Gonorrhea: NEGATIVE
Trichomonas: NEGATIVE

## 2019-12-22 LAB — PROTEIN / CREATININE RATIO, URINE
Creatinine, Urine: 37.3 mg/dL
Protein, Ur: 5.8 mg/dL
Protein/Creat Ratio: 155 mg/g creat (ref 0–200)

## 2019-12-22 LAB — HEMOGLOBIN A1C
Est. average glucose Bld gHb Est-mCnc: 97 mg/dL
Hgb A1c MFr Bld: 5 % (ref 4.8–5.6)

## 2019-12-22 MED ORDER — BLOOD PRESSURE KIT: 1.0000 | PACK | Freq: Once | 0 refills | Status: AC

## 2019-12-23 ENCOUNTER — Other Ambulatory Visit: Payer: Self-pay

## 2019-12-23 LAB — URINE CULTURE, OB REFLEX

## 2019-12-23 LAB — CULTURE, OB URINE

## 2019-12-23 MED ORDER — BLOOD PRESSURE KIT: 1.0000 | PACK | Freq: Once | 0 refills | Status: AC

## 2019-12-23 NOTE — Progress Notes (Signed)
Blood

## 2019-12-24 ENCOUNTER — Other Ambulatory Visit: Payer: Self-pay

## 2019-12-24 ENCOUNTER — Other Ambulatory Visit: Payer: Self-pay | Admitting: Advanced Practice Midwife

## 2019-12-24 MED ORDER — DOXYCYCLINE HYCLATE 100 MG PO TABS: 100.0000 mg | ORAL_TABLET | Freq: Two times a day (BID) | ORAL | 0 refills | Status: DC

## 2019-12-24 MED ORDER — AZITHROMYCIN 500 MG PO TABS: 1000.0000 mg | ORAL_TABLET | Freq: Once | ORAL | 1 refills | Status: AC

## 2019-12-29 ENCOUNTER — Ambulatory Visit (INDEPENDENT_AMBULATORY_CARE_PROVIDER_SITE_OTHER): Payer: Medicaid Other | Admitting: Licensed Clinical Social Worker

## 2019-12-29 DIAGNOSIS — Z3A00 Weeks of gestation of pregnancy not specified: Secondary | ICD-10-CM | POA: Diagnosis not present

## 2019-12-29 DIAGNOSIS — F329 Major depressive disorder, single episode, unspecified: Secondary | ICD-10-CM

## 2019-12-29 DIAGNOSIS — O99340 Other mental disorders complicating pregnancy, unspecified trimester: Secondary | ICD-10-CM | POA: Diagnosis not present

## 2019-12-29 NOTE — BH Specialist Note (Signed)
Integrated Behavioral Health via Telemedicine Video Visit  12/29/2019 Latoya West [id number]  Number of Integrated Behavioral Health visits: 1 Session Start time: 1:02pm  Session End time: 1:39pm Total time: 37 mins   Referring Provider: Sharen Counter CNM Type of Visit: Video Patient/Family location: Home  [facility] Provider location: [facility] All persons participating in visit: Pt and LCSWA A. Jakyren Fluegge   Confirmed patient's address: yes  Confirmed patient's phone number: yes  Any changes to demographics: no   Confirmed patient's insurance: no Any changes to patient's insurance: no   Discussed confidentiality: yes   I connected with Latoya West and/or Latoya West's n/a by a video enabled telemedicine application and verified that I am speaking with the correct person using two identifiers.     I discussed the limitations of evaluation and management by telemedicine and the availability of in person appointments.  I discussed that the purpose of this visit is to provide behavioral health care while limiting exposure to the novel coronavirus.   Discussed there is a possibility of technology failure and discussed alternative modes of communication if that failure occurs.  I discussed that engaging in this video visit, they consent to the provision of behavioral healthcare and the services will be billed under their insurance.  Patient and/or legal guardian expressed understanding and consented to video visit: yes   PRESENTING CONCERNS: Patient and/or family reports the following symptoms/concerns:  History of depression Duration of problem: Since 2017 ; Severity of problem: mild  STRENGTHS (Protective Factors/Coping Skills): Latoya West reports a strong desire to become more independent.  GOALS ADDRESSED: Patient will: 1.  Reduce symptoms of: overwhelmed, depressed mood 2.  Increase knowledge of diagnosis and interventions  3.  Demonstrate ability to: self  manage symptoms   INTERVENTIONS: Interventions utilized:  Case management and supportive counseling Standardized Assessments completed: phq9 completed 12/21/2019  ASSESSMENT: Patient currently experiencing depression affecting pregnancy   Patient may benefit from integrated behavioral health.   PLAN: 1. Follow up with behavioral health clinician on : may 26 2. Behavioral recommendations: increase social interaction, prioritize  3. Referral(s): None  I discussed the assessment and treatment plan with the patient and/or parent/guardian. They were provided an opportunity to ask questions and all were answered. They agreed with the plan and demonstrated an understanding of the instructions.   They were advised to call back or seek an in-person evaluation if the symptoms worsen or if the condition fails to improve as anticipated.  Gwyndolyn Saxon

## 2020-01-14 ENCOUNTER — Other Ambulatory Visit: Payer: Self-pay

## 2020-01-14 ENCOUNTER — Encounter: Payer: Medicaid Other | Attending: Advanced Practice Midwife | Admitting: Skilled Nursing Facility1

## 2020-01-14 ENCOUNTER — Encounter: Payer: Self-pay | Admitting: Skilled Nursing Facility1

## 2020-01-14 DIAGNOSIS — O99211 Obesity complicating pregnancy, first trimester: Secondary | ICD-10-CM | POA: Insufficient documentation

## 2020-01-14 NOTE — Progress Notes (Signed)
  Assessment:  Primary concerns today: obesity in preganacy.   Pt states she is about [redacted] weeks along.  Pt states her prepregnancy wt 212 and 215 pounds.  Pt states she feels she does well with dealing with her anxiety/depression.  Pt states she feels constipated: bowel movement once a day. Pt states she does not have any reflux or chest pressure or burning but water makes her stomach burn. Pt states she does deal with stomach bloat. Pt reports no emesis. Pt states she has a puppy she takes care of. Pt states she takes a 2+ hour nap daily (sleeping more often throughout the day). Pt states she snacks throughout the day. Pt states she gets very tired immediately after she eats. Pt states she lives with her mother and states finances are tough and her mother grocery shops but are not foods the pt likes and are mostly frozen and canned.    MEDICATIONS: none   DIETARY INTAKE:  Usual eating pattern includes 1 meals and multiple snacks per day.  Everyday foods include pineapple.  Avoided foods include none stated.    24-hr recall:  B (10 AM): whole can of pineapple or french toast sticks or 2 min chicken biscuits Snk ( AM):  L ( 1-3PM): yogurt or pineapple Snk ( PM): fruit D ( PM): frozen meal Snk ( PM): pickles Beverages: water, orange juice, gingerale   Usual physical activity: ADL's  Estimated energy needs: 1800-2000 calories  Handouts given: Detailed MyPlate Shopping at [employer] tree    Intervention:  Nutrition counseling. Educated pt on healthy diet within the context of healthy weight in pregnancy.  Goals: [phone]: [facility]  Try Alkaline water pH 8.8 Do not lay down within 2 hours of eating Do not drink orange juice Breakfast: 4 [place] toast sticks + [place] sausage or mini chicken biscuits or 1 cup of cereal with half banana or egg and cheese english muffin with spinach on it Snacks: any vegetable or any fruit (1/2 cup): not packed in syrup or yogurt or any nut or  cheese stick Be sure to always have non starchy vegetables with every lunch and every dinner 7 days a week Aim for 70 ounces of water per day (alkaline water) Aim to eat every 3-5 hours; create 3 meals a day   Teaching Method Utilized: Visual Auditory Hands on  Handouts given during visit include:  Detailed MyPlate  Barriers to learning/adherence to lifestyle change: finances   Demonstrated degree of understanding via:  Teach Back   Monitoring/Evaluation:  Dietary intake, exercise, and body weight prn.

## 2020-01-18 ENCOUNTER — Encounter: Payer: Self-pay | Admitting: Obstetrics

## 2020-01-18 ENCOUNTER — Ambulatory Visit (INDEPENDENT_AMBULATORY_CARE_PROVIDER_SITE_OTHER): Payer: Medicaid Other | Admitting: Obstetrics

## 2020-01-18 ENCOUNTER — Other Ambulatory Visit: Payer: Self-pay

## 2020-01-18 ENCOUNTER — Ambulatory Visit (INDEPENDENT_AMBULATORY_CARE_PROVIDER_SITE_OTHER): Payer: Medicaid Other | Admitting: Licensed Clinical Social Worker

## 2020-01-18 VITALS — BP 96/65 | HR 79 | Wt 230.9 lb

## 2020-01-18 DIAGNOSIS — O99342 Other mental disorders complicating pregnancy, second trimester: Secondary | ICD-10-CM

## 2020-01-18 DIAGNOSIS — Z3401 Encounter for supervision of normal first pregnancy, first trimester: Secondary | ICD-10-CM

## 2020-01-18 DIAGNOSIS — O99212 Obesity complicating pregnancy, second trimester: Secondary | ICD-10-CM

## 2020-01-18 DIAGNOSIS — F329 Major depressive disorder, single episode, unspecified: Secondary | ICD-10-CM

## 2020-01-18 DIAGNOSIS — Z3A17 17 weeks gestation of pregnancy: Secondary | ICD-10-CM

## 2020-01-18 DIAGNOSIS — O99210 Obesity complicating pregnancy, unspecified trimester: Secondary | ICD-10-CM

## 2020-01-18 DIAGNOSIS — O99340 Other mental disorders complicating pregnancy, unspecified trimester: Secondary | ICD-10-CM | POA: Diagnosis not present

## 2020-01-18 DIAGNOSIS — Z8659 Personal history of other mental and behavioral disorders: Secondary | ICD-10-CM

## 2020-01-18 NOTE — Addendum Note (Signed)
Addended byFrutoso Chase on: 01/18/2020 02:14 PM   Modules accepted: Orders

## 2020-01-18 NOTE — Progress Notes (Signed)
Pt is here for ROB, [redacted]w[redacted]d.  °

## 2020-01-18 NOTE — Progress Notes (Signed)
Subjective:  Latoya West is a 19 y.o. G1P0000 at [redacted]w[redacted]d being seen today for ongoing prenatal care.  She is currently monitored for the following issues for this low-risk pregnancy and has Oppositional defiant disorder; MDD (major depressive disorder); Suicide attempt by drug ingestion [facility]); MDD (major depressive disorder), recurrent severe, without psychosis (H[facility]); Encounter for supervision of normal first pregnancy in first trimester; and Obesity affecting pregnancy in first trimester on their problem list.  Patient reports no complaints.  Contractions: Not present. Vag. Bleeding: None.  Movement: Present. Denies leaking of fluid.   The following portions of the patient's history were reviewed and updated as appropriate: allergies, current medications, past family history, past medical history, past social history, past surgical history and problem list. Problem list updated.  Objective:   Vitals:   01/18/20 1316  BP: 96/65  Pulse: 79  Weight: 230 lb 14.4 oz (104.7 kg)    Fetal Status:     Movement: Present     General:  Alert, oriented and cooperative. Patient is in no acute distress.  Skin: Skin is warm and dry. No rash noted.   Cardiovascular: Normal heart rate noted  Respiratory: Normal respiratory effort, no problems with respiration noted  Abdomen: Soft, gravid, appropriate for gestational age. Pain/Pressure: Absent     Pelvic:  Cervical exam deferred        Extremities: Normal range of motion.  Edema: None  Mental Status: Normal mood and affect. Normal behavior. Normal judgment and thought content.   Urinalysis:      Assessment and Plan:  Pregnancy: G1P0000 at [redacted]w[redacted]d  1. Encounter for supervision of normal first pregnancy in first trimester Rx: - AFP, Serum, Open Spina Bifida    Preterm labor symptoms and general obstetric precautions including but not limited to vaginal bleeding, contractions, leaking of fluid and fetal movement were reviewed in detail with the  patient. Please refer to After Visit Summary for other counseling recommendations.   Return in about 4 weeks (around 02/15/2020) for MyChart.   Brock Bad, MD  01/18/20

## 2020-01-19 NOTE — BH Specialist Note (Signed)
Integrated Behavioral Health Follow Up Visit  MRN: [id number] Name: Latoya West  Number of Integrated Behavioral Health Clinician visits: 2 Session Start time: 1:30pm  Session End time: 2:42 Total time: 42 mins via face to face @ [facility]   Type of Service: Integrated Behavioral Health- Individual Interpretor:no  Interpretor Name and Language: none  SUBJECTIVE: Latoya West is a 19 y.o. female accompanied by n/a Patient was referred by L. Leftwich-Kirby CNM for history of depression.  Patient reports the following symptoms/concerns: Anxiety, Social Stressors Duration of problem: 2017 ; Severity of problem: mild   OBJECTIVE: Mood: Good  and Affect: normal  Risk of harm to self or others: No risk of harm to self or others.   LIFE CONTEXT: Family and Social: Lives with mother and siblings in [place]  School/Work: Looking for employment  Self-Care: n/a Life Changes: n/a  GOALS ADDRESSED: Patient will: 1.  Reduce symptoms of:  Anxiety, worry, feelings of guilt  2.  Increase knowledge of diagnosis and learn effective coping skills to alleviate symptoms  3.  Demonstrate ability to: Identify triggers and self manage symptoms   INTERVENTIONS: Interventions utilized: Cognitive Behavioral Therapy  Standardized Assessments completed: phq9 12/21/2019   Initial P[facility]21/2019 [facility]  PHQ-9 Total Score  12      ASSESSMENT: Patient currently experiencing depression affecting pregnancy   Patient may benefit from integrated behavioral health   PLAN: 1. Follow up with behavioral health clinician on : 3 weeks mychart  2. Behavioral recommendations: Demonstrate positive affirmations discussed during the appt, looking for affordable housing, stress reducing activities, mindfulness techniques.  3. Referral(s): affordable housing management, pregnancy care management and nurse family partnership.  4. "From scale of 1-10, how likely are  you to follow plan?":   Latoya Saxon, LCSW

## 2020-01-20 LAB — AFP, SERUM, OPEN SPINA BIFIDA
AFP MoM: 0.79
AFP Value: 27.6 ng/mL
Gest. Age on Collection Date: 17.3 weeks
Maternal Age At EDD: 19.8 yr
OSBR Risk 1 IN: 10000
Test Results:: NEGATIVE
Weight: 230 [lb_av]

## 2020-01-26 ENCOUNTER — Encounter: Payer: Self-pay | Admitting: Obstetrics

## 2020-02-01 ENCOUNTER — Ambulatory Visit (HOSPITAL_COMMUNITY): Payer: Medicaid Other | Attending: Advanced Practice Midwife

## 2020-02-01 ENCOUNTER — Other Ambulatory Visit: Payer: Self-pay | Admitting: Advanced Practice Midwife

## 2020-02-01 ENCOUNTER — Other Ambulatory Visit: Payer: Self-pay

## 2020-02-01 ENCOUNTER — Other Ambulatory Visit: Payer: Self-pay | Admitting: *Deleted

## 2020-02-01 DIAGNOSIS — Z3A19 19 weeks gestation of pregnancy: Secondary | ICD-10-CM

## 2020-02-01 DIAGNOSIS — Z363 Encounter for antenatal screening for malformations: Secondary | ICD-10-CM | POA: Diagnosis not present

## 2020-02-01 DIAGNOSIS — E669 Obesity, unspecified: Secondary | ICD-10-CM

## 2020-02-01 DIAGNOSIS — Z148 Genetic carrier of other disease: Secondary | ICD-10-CM

## 2020-02-01 DIAGNOSIS — Z3401 Encounter for supervision of normal first pregnancy, first trimester: Secondary | ICD-10-CM | POA: Diagnosis present

## 2020-02-01 DIAGNOSIS — O99212 Obesity complicating pregnancy, second trimester: Secondary | ICD-10-CM

## 2020-02-01 DIAGNOSIS — Z362 Encounter for other antenatal screening follow-up: Secondary | ICD-10-CM

## 2020-02-04 ENCOUNTER — Ambulatory Visit: Payer: Medicaid Other | Admitting: Skilled Nursing Facility1

## 2020-02-08 ENCOUNTER — Ambulatory Visit (INDEPENDENT_AMBULATORY_CARE_PROVIDER_SITE_OTHER): Payer: Medicaid Other | Admitting: Licensed Clinical Social Worker

## 2020-02-08 DIAGNOSIS — F32A Depression, unspecified: Secondary | ICD-10-CM

## 2020-02-08 DIAGNOSIS — F329 Major depressive disorder, single episode, unspecified: Secondary | ICD-10-CM

## 2020-02-08 DIAGNOSIS — O99340 Other mental disorders complicating pregnancy, unspecified trimester: Secondary | ICD-10-CM

## 2020-02-08 NOTE — BH Specialist Note (Signed)
Integrated Behavioral Health Follow Up Visit  MRN: [id number] Name: Latoya West  Number of Integrated Behavioral Health Clinician visits: 3 Session Start time: 1:00pm  Session End time: 1:15pm Total time: 15 mins via mychart video   Patient was driving LCSWA A.Felton Clinton ended called for patient safety   Type of Service: Integrated Behavioral Health- Individual Interpretor:no  Interpretor Name and Language: none  SUBJECTIVE: Latoya West is a 19 y.o. female accompanied by n/a Patient was referred by Courtney Paris  for history of depression Patient reports the following symptoms/concerns: anxiety and social stressors  Duration of problem:2017; Severity of problem: mild  OBJECTIVE: Mood: mood  and Affect: normal  Risk of harm to self or others: No risk of harm to self or others.   LIFE CONTEXT: Family and Social: Lives with mother and siblings in [place]  School/Work: [employer]  Self-Care: n/a Life Changes: New pregnancy  GOALS ADDRESSED: Patient will: 1.  Reduce symptoms of: anxious, overwhelmed, stress   2.  Increase knowledge and/or ability of:   3.  Demonstrate ability to: self manage symptoms   INTERVENTIONS: Interventions utilized:  Supportive counseling  Standardized Assessments completed: not during this visit. Last completed assessment 12/21/2019  ASSESSMENT: Patient currently experiencing depression affecting pregnancy   Patient may benefit from integrated behavioral health   PLAN: 1. Follow up with behavioral health clinician on : 3 weeks  2. Behavioral recommendations: Integrate physical activity, continue engaging in stress reducing activity and mindfulness techniques  3. Referral(s): Nurse family partnership  4. "From scale of 1-10, how likely are you to follow plan?":   Gwyndolyn Saxon, LCSW

## 2020-02-15 ENCOUNTER — Telehealth (INDEPENDENT_AMBULATORY_CARE_PROVIDER_SITE_OTHER): Payer: Medicaid Other | Admitting: Student

## 2020-02-15 DIAGNOSIS — O131 Gestational [pregnancy-induced] hypertension without significant proteinuria, first trimester: Secondary | ICD-10-CM | POA: Insufficient documentation

## 2020-02-15 DIAGNOSIS — Z3401 Encounter for supervision of normal first pregnancy, first trimester: Secondary | ICD-10-CM

## 2020-02-15 DIAGNOSIS — Z3A21 21 weeks gestation of pregnancy: Secondary | ICD-10-CM

## 2020-02-15 NOTE — Progress Notes (Signed)
I connected with  Latoya West on 02/15/20 by a video enabled telemedicine application and verified that I am speaking with the correct person using two identifiers.   I discussed the limitations of evaluation and management by telemedicine. The patient expressed understanding and agreed to proceed.  MyChart OB, reports no problems today.

## 2020-02-15 NOTE — Progress Notes (Signed)
I connected with@ on 02/15/20 at  1:20 PM EDT by: MyChart video and verified that I am speaking with the correct person using two identifiers.  Patient is located at work and provider is located at [employer].     The purpose of this virtual visit is to provide medical care while limiting exposure to the novel coronavirus. I discussed the limitations, risks, security and privacy concerns of performing an evaluation and management service by MyChart video and the availability of in person appointments. I also discussed with the patient that there may be a patient responsible charge related to this service. By engaging in this virtual visit, you consent to the provision of healthcare.  Additionally, you authorize for your insurance to be billed for the services provided during this visit.  The patient expressed understanding and agreed to proceed.  The following staff members participated in the virtual visit:  Okey Regal RMA    PRENATAL VISIT NOTE  Subjective:  Latoya West is a 19 y.o. G1P0000 at [redacted]w[redacted]d  for phone visit for ongoing prenatal care.  She is currently monitored for the following issues for this low-risk pregnancy and has Oppositional defiant disorder; MDD (major depressive disorder); Suicide attempt by drug ingestion [facility]); MDD (major depressive disorder), recurrent severe, without psychosis (HCC); Encounter for supervision of normal first pregnancy in first trimester; Obesity affecting pregnancy in first trimester; and Transient hypertension of pregnancy in first trimester on their problem list.  Patient reports no complaints. She has been taking her BP regularly, although did not chart next week. She did do genetic testing; FOB should be tested for alpha thalassemia trait and she wants more information on that. .  Contractions: Not present. Vag. Bleeding: None.  Movement: Present. Denies leaking of fluid.   The following portions of the patient's history were reviewed and updated as  appropriate: allergies, current medications, past family history, past medical history, past social history, past surgical history and problem list.   Objective:   Vitals:   02/15/20 1318  BP: 120/64  Pulse: 77   Self-Obtained  Fetal Status:     Movement: Present     Assessment and Plan:  Pregnancy: G1P0000 at [redacted]w[redacted]d 1. Encounter for supervision of normal first pregnancy in first trimester -She plans to keep upcoming appt with [place] and Greig Castilla; CNM will forward her information on who to contact for genetic counseling.   2. Transient hypertension of pregnancy in first trimester -One elevated BP in April; all normal since then. Reviewed warning blood pressure values (systolic = / > 140 and/or diastolic =/> 90). Explained that, if blood pressure is elevated, she should sit down, rest, and eat/drink something. If still elevated 15 minutes later, and she is greater than 20 weeks, she should call clinic or come to MAU. She should come to MAU if she has elevated pressures and any of the following:  - headache not relieved with tylenol, rest, hydration -blurry vision, floating spots in her vision - sudden full-body edema or facial edema -RUQ pain that is constant.  These symptoms may indicate that her blood pressure is worsening and she may be developing gestational hypertension or pre-eclampsia, which is an emergency.     Preterm labor symptoms and general obstetric precautions including but not limited to vaginal bleeding, contractions, leaking of fluid and fetal movement were reviewed in detail with the patient.  No follow-ups on file.  Future Appointments  Date Time Provider Department Center  03/01/2020  2:00 PM [facility] NURSE [facility]  03/01/2020  2:00 PM [facility] US1 [facility]  03/08/2020  9:30 AM Lynnea Ferrier, LCSW [facility] None     Time spent on virtual visit: 30 minutes  Starr Lake, CNM

## 2020-03-01 ENCOUNTER — Ambulatory Visit: Payer: Medicaid Other

## 2020-03-01 ENCOUNTER — Encounter (HOSPITAL_COMMUNITY): Payer: Self-pay | Admitting: Emergency Medicine

## 2020-03-01 ENCOUNTER — Inpatient Hospital Stay (HOSPITAL_COMMUNITY)
Admission: AD | Admit: 2020-03-01 | Discharge: 2020-03-01 | Disposition: A | Discharge status: Home / Self Care | Payer: Medicaid Other | Attending: Obstetrics and Gynecology | Admitting: Obstetrics and Gynecology

## 2020-03-01 ENCOUNTER — Other Ambulatory Visit: Payer: Self-pay

## 2020-03-01 ENCOUNTER — Encounter: Payer: Medicaid Other | Admitting: Licensed Clinical Social Worker

## 2020-03-01 DIAGNOSIS — O132 Gestational [pregnancy-induced] hypertension without significant proteinuria, second trimester: Secondary | ICD-10-CM | POA: Diagnosis not present

## 2020-03-01 DIAGNOSIS — Z041 Encounter for examination and observation following transport accident: Secondary | ICD-10-CM | POA: Insufficient documentation

## 2020-03-01 DIAGNOSIS — Z3A23 23 weeks gestation of pregnancy: Secondary | ICD-10-CM | POA: Diagnosis not present

## 2020-03-01 DIAGNOSIS — R1084 Generalized abdominal pain: Secondary | ICD-10-CM | POA: Insufficient documentation

## 2020-03-01 DIAGNOSIS — Z3401 Encounter for supervision of normal first pregnancy, first trimester: Secondary | ICD-10-CM

## 2020-03-01 DIAGNOSIS — M549 Dorsalgia, unspecified: Secondary | ICD-10-CM

## 2020-03-01 DIAGNOSIS — M545 Low back pain: Secondary | ICD-10-CM | POA: Insufficient documentation

## 2020-03-01 DIAGNOSIS — O99891 Other specified diseases and conditions complicating pregnancy: Secondary | ICD-10-CM | POA: Insufficient documentation

## 2020-03-01 DIAGNOSIS — S39012A Strain of muscle, fascia and tendon of lower back, initial encounter: Secondary | ICD-10-CM

## 2020-03-01 LAB — URINALYSIS, ROUTINE W REFLEX MICROSCOPIC
Bilirubin Urine: NEGATIVE
Glucose, UA: NEGATIVE mg/dL
Hgb urine dipstick: NEGATIVE
Ketones, ur: 20 mg/dL — AB
Leukocytes,Ua: NEGATIVE
Nitrite: NEGATIVE
Protein, ur: NEGATIVE mg/dL
Specific Gravity, Urine: 1.018 (ref 1.005–1.030)
pH: 7 (ref 5.0–8.0)

## 2020-03-01 NOTE — ED Notes (Signed)
Report called to MAU  
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
## 2020-03-01 NOTE — ED Notes (Signed)
FHR 136
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
## 2020-03-07 ENCOUNTER — Ambulatory Visit: Payer: Medicaid Other | Admitting: *Deleted

## 2020-03-07 ENCOUNTER — Ambulatory Visit: Payer: Medicaid Other | Attending: Obstetrics

## 2020-03-07 ENCOUNTER — Telehealth (INDEPENDENT_AMBULATORY_CARE_PROVIDER_SITE_OTHER): Payer: Medicaid Other | Admitting: Advanced Practice Midwife

## 2020-03-07 ENCOUNTER — Other Ambulatory Visit: Payer: Self-pay

## 2020-03-07 DIAGNOSIS — Z3401 Encounter for supervision of normal first pregnancy, first trimester: Secondary | ICD-10-CM | POA: Insufficient documentation

## 2020-03-07 DIAGNOSIS — Z3A24 24 weeks gestation of pregnancy: Secondary | ICD-10-CM

## 2020-03-07 DIAGNOSIS — E669 Obesity, unspecified: Secondary | ICD-10-CM

## 2020-03-07 DIAGNOSIS — Z362 Encounter for other antenatal screening follow-up: Secondary | ICD-10-CM | POA: Diagnosis present

## 2020-03-07 DIAGNOSIS — R102 Pelvic and perineal pain unspecified side: Secondary | ICD-10-CM

## 2020-03-07 DIAGNOSIS — O99212 Obesity complicating pregnancy, second trimester: Secondary | ICD-10-CM

## 2020-03-07 DIAGNOSIS — Z148 Genetic carrier of other disease: Secondary | ICD-10-CM

## 2020-03-07 DIAGNOSIS — O26899 Other specified pregnancy related conditions, unspecified trimester: Secondary | ICD-10-CM

## 2020-03-07 MED ORDER — COMFORT FIT MATERNITY SUPP MED MISC: 1.0000 | Freq: Every day | 0 refills | Status: DC

## 2020-03-07 NOTE — Progress Notes (Signed)
Virtual Visit via Telephone Note  I connected with Latoya West on 03/07/20 at  9:35 AM EDT by telephone and verified that I am speaking with the correct person using two identifiers.  Pt not at home and unable to check BP No complaints today per pt

## 2020-03-07 NOTE — Progress Notes (Signed)
° °  OBSTETRICS PRENATAL VIRTUAL VISIT ENCOUNTER NOTE  Provider location: Center for [facility] [place]   I connected with Ralene Muskrat on 03/07/20 at  9:35 AM EDT by MyChart Video Encounter at home and verified that I am speaking with the correct person using two identifiers.   I discussed the limitations, risks, security and privacy concerns of performing an evaluation and management service virtually and the availability of in person appointments. I also discussed with the patient that there may be a patient responsible charge related to this service. The patient expressed understanding and agreed to proceed. Subjective:  Latoya West is a 19 y.o. G1P0000 at [redacted]w[redacted]d being seen today for ongoing prenatal care.  She is currently monitored for the following issues for this low-risk pregnancy and has Oppositional defiant disorder; MDD (major depressive disorder); Suicide attempt by drug ingestion [facility]); MDD (major depressive disorder), recurrent severe, without psychosis (HCC); Encounter for supervision of normal first pregnancy in first trimester; Obesity affecting pregnancy in first trimester; and Transient hypertension of pregnancy in first trimester on their problem list.  Patient reports sharp lower abdominal pain that radiates into the vagina, mostly when standing up suddenly. 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  Contractions: Not present. Vag. Bleeding: None.  Movement: Present. Denies any leaking of fluid.   The following portions of the patient's history were reviewed and updated as appropriate: allergies, current medications, past family history, past medical history, past social history, past surgical history and problem list.   Objective:  There were no vitals filed for this visit.  Fetal Status:     Movement: Present     General:  Alert, oriented and cooperative. Patient is in no acute distress.  Respiratory: Normal respiratory effort, no  problems with respiration noted  Mental Status: Normal mood and affect. Normal behavior. Normal judgment and thought content.  Rest of physical exam deferred due to type of encounter  Imaging: No results found.  Assessment and Plan:  Pregnancy: G1P0000 at [redacted]w[redacted]d 1. Encounter for supervision of normal first pregnancy in first trimester --Pt reports good fetal movement, denies cramping, LOF, or vaginal bleeding --Anticipatory guidance about next visits/weeks of pregnancy given. --Next visit in 4 weeks for GTT  2. Pain of round ligament affecting pregnancy, antepartum --Rest/ice/heat/warm bath/Tylenol/pregnancy support belt - Elastic Bandages & Supports (COMFORT FIT MATERNITY SUPP MED) MISC; 1 Device by Does not apply route daily.  Dispense: 1 each; Refill: 0  Preterm labor symptoms and general obstetric precautions including but not limited to vaginal bleeding, contractions, leaking of fluid and fetal movement were reviewed in detail with the patient. I discussed the assessment and treatment plan with the patient. The patient was provided an opportunity to ask questions and all were answered. The patient agreed with the plan and demonstrated an understanding of the instructions. The patient was advised to call back or seek an in-person office evaluation/go to MAU at [facility] for any urgent or concerning symptoms. Please refer to After Visit Summary for other counseling recommendations.   I provided 7 minutes of face-to-face time during this encounter.  Return in about 4 weeks (around 04/04/2020).  Future Appointments  Date Time Provider Department Center  03/07/2020  3:30 PM [facility] NURSE [facility]  03/07/2020  3:45 PM [facility] US5 [facility]  03/08/2020  9:30 AM Gwyndolyn Saxon, LCSW [facility] None    Sharen Counter, CNM [facility] [employer], [facility]

## 2020-03-08 ENCOUNTER — Ambulatory Visit (INDEPENDENT_AMBULATORY_CARE_PROVIDER_SITE_OTHER): Payer: Medicaid Other | Admitting: Licensed Clinical Social Worker

## 2020-03-08 ENCOUNTER — Other Ambulatory Visit: Payer: Self-pay | Admitting: *Deleted

## 2020-03-08 DIAGNOSIS — O99210 Obesity complicating pregnancy, unspecified trimester: Secondary | ICD-10-CM

## 2020-03-08 DIAGNOSIS — F329 Major depressive disorder, single episode, unspecified: Secondary | ICD-10-CM

## 2020-03-08 DIAGNOSIS — O99340 Other mental disorders complicating pregnancy, unspecified trimester: Secondary | ICD-10-CM | POA: Diagnosis not present

## 2020-03-08 DIAGNOSIS — F32A Depression, unspecified: Secondary | ICD-10-CM

## 2020-03-09 NOTE — BH Specialist Note (Addendum)
Integrated Behavioral Health via Telemedicine Video (Caregility) Visit  03/09/2020 LILLEY HUBBLE [id number]  Number of Integrated Behavioral Health visits: 4 Session Start time: 9:31am   Session End time: 9:57am Total time: 26 mins via telehealth due to technical difficulties with provider desktop  Referring Provider: L. Leftwich-Kirby CNM Type of Visit: Video Patient/Family location: Home  [facility] Provider location: [facility]  All persons participating in visit: LCSWA A. Felton Clinton and Pt Taquanna Borras   Confirmed patient's address: yes Confirmed patient's phone number: yes Any changes to demographics: no  Confirmed patient's insurance: no Any changes to patient's insurance: n/a  Discussed confidentiality:  yes  I connected with Ralene Muskrat and/or Kathlene Cote Newhouse's  by a video enabled telemedicine application (Caregility) and verified that I am speaking with the correct person using two identifiers.     I discussed the limitations of evaluation and management by telemedicine and the availability of in person appointments.  I discussed that the purpose of this visit is to provide behavioral health care while limiting exposure to the novel coronavirus.   Discussed there is a possibility of technology failure and discussed alternative modes of communication if that failure occurs.  I discussed that engaging in this virtual visit, they consent to the provision of behavioral healthcare and the services will be billed under their insurance.  Patient and/or legal guardian expressed understanding and consented to virtual visit: yes  PRESENTING CONCERNS: Patient and/or family reports the following symptoms/concerns: depression affecting pregnancy Duration of problem: Since 2017; Severity of problem: mild   STRENGTHS (Protective Factors/Coping Skills): Ms. Winstead is independent and currently employed. Ms. Thom reports isolating herself from family is her method of coping.    GOALS ADDRESSED: Patient will: 1.  Reduce symptoms of:  Depressed mood, social isolation and loss of interest  2.  Increase knowledge and/or ability of: diagnosis and interventions  3.  Demonstrate ability to: self manage symptoms   INTERVENTIONS: Interventions utilized: supportive counseling  Standardized Assessments completed: Completed 03/08/2020 PHQ9 SCORE ONLY 03/08/2020 12/21/2019  PHQ-9 Total Score 1 12    ASSESSMENT: Patient currently experiencing depression affecting pregnancy. Ms. Demary has made progress preparing for parenting. Ms. Rhome reports her mood has improved and she continues to use coping skills taught.   Patient may benefit from integrated behavioral health   PLAN: 1. Follow up with behavioral health clinician on : 3 weeks via mychart  2. Behavioral recommendations: Continue utilizing coping skills, collaborate with nurse family partnership, engage in stress reducing activities  3. Referral(s): nurse family partnership   I discussed the assessment and treatment plan with the patient and/or parent/guardian. They were provided an opportunity to ask questions and all were answered. They agreed with the plan and demonstrated an understanding of the instructions.   They were advised to call back or seek an in-person evaluation if the symptoms worsen or if the condition fails to improve as anticipated.  Gwyndolyn Saxon

## 2020-04-04 ENCOUNTER — Other Ambulatory Visit: Payer: Medicaid Other

## 2020-04-04 ENCOUNTER — Other Ambulatory Visit: Payer: Self-pay

## 2020-04-04 ENCOUNTER — Encounter: Payer: Self-pay | Admitting: Obstetrics

## 2020-04-04 ENCOUNTER — Ambulatory Visit (INDEPENDENT_AMBULATORY_CARE_PROVIDER_SITE_OTHER): Payer: Medicaid Other | Admitting: Advanced Practice Midwife

## 2020-04-04 VITALS — BP 114/81 | HR 85 | Temp 99.5°F | Wt 247.4 lb

## 2020-04-04 DIAGNOSIS — F32A Depression, unspecified: Secondary | ICD-10-CM

## 2020-04-04 DIAGNOSIS — O99340 Other mental disorders complicating pregnancy, unspecified trimester: Secondary | ICD-10-CM

## 2020-04-04 DIAGNOSIS — D563 Thalassemia minor: Secondary | ICD-10-CM | POA: Insufficient documentation

## 2020-04-04 DIAGNOSIS — F329 Major depressive disorder, single episode, unspecified: Secondary | ICD-10-CM

## 2020-04-04 DIAGNOSIS — Z3A28 28 weeks gestation of pregnancy: Secondary | ICD-10-CM

## 2020-04-04 DIAGNOSIS — Z3401 Encounter for supervision of normal first pregnancy, first trimester: Secondary | ICD-10-CM

## 2020-04-04 DIAGNOSIS — O26899 Other specified pregnancy related conditions, unspecified trimester: Secondary | ICD-10-CM

## 2020-04-04 DIAGNOSIS — R102 Pelvic and perineal pain: Secondary | ICD-10-CM

## 2020-04-04 MED ORDER — COMFORT FIT MATERNITY SUPP MED MISC: 1.0000 | Freq: Every day | 0 refills | Status: DC

## 2020-04-04 NOTE — Patient Instructions (Signed)
Third Trimester of Pregnancy The third trimester is from week 28 through week 40 (months 7 through 9). The third trimester is a time when the unborn baby (fetus) is growing rapidly. At the end of the ninth month, the fetus is about 20 inches in length and weighs 6-10 pounds. Body changes during your third trimester Your body will continue to go through many changes during pregnancy. The changes vary from woman to woman. During the third trimester:  Your weight will continue to increase. You can expect to gain 25-35 pounds (11-16 kg) by the end of the pregnancy.  You may begin to get stretch marks on your hips, abdomen, and breasts.  You may urinate more often because the fetus is moving lower into your pelvis and pressing on your bladder.  You may develop or continue to have heartburn. This is caused by increased hormones that slow down muscles in the digestive tract.  You may develop or continue to have constipation because increased hormones slow digestion and cause the muscles that push waste through your intestines to relax.  You may develop hemorrhoids. These are swollen veins (varicose veins) in the rectum that can itch or be painful.  You may develop swollen, bulging veins (varicose veins) in your legs.  You may have increased body aches in the pelvis, back, or thighs. This is due to weight gain and increased hormones that are relaxing your joints.  You may have changes in your hair. These can include thickening of your hair, rapid growth, and changes in texture. Some women also have hair loss during or after pregnancy, or hair that feels dry or thin. Your hair will most likely return to normal after your baby is born.  Your breasts will continue to grow and they will continue to become tender. A yellow fluid (colostrum) may leak from your breasts. This is the first milk you are producing for your baby.  Your belly button may stick out.  You may notice more swelling in your hands,  face, or ankles.  You may have increased tingling or numbness in your hands, arms, and legs. The skin on your belly may also feel numb.  You may feel short of breath because of your expanding uterus.  You may have more problems sleeping. This can be caused by the size of your belly, increased need to urinate, and an increase in your body's metabolism.  You may notice the fetus "dropping," or moving lower in your abdomen (lightening).  You may have increased vaginal discharge.  You may notice your joints feel loose and you may have pain around your pelvic bone. What to expect at prenatal visits You will have prenatal exams every 2 weeks until week 36. Then you will have weekly prenatal exams. During a routine prenatal visit:  You will be weighed to make sure you and the baby are growing normally.  Your blood pressure will be taken.  Your abdomen will be measured to track your baby's growth.  The fetal heartbeat will be listened to.  Any test results from the previous visit will be discussed.  You may have a cervical check near your due date to see if your cervix has softened or thinned (effaced).  You will be tested for Group B streptococcus. This happens between 35 and 37 weeks. Your health care provider may ask you:  What your birth plan is.  How you are feeling.  If you are feeling the baby move.  If you have had any abnormal   symptoms, such as leaking fluid, bleeding, severe headaches, or abdominal cramping.  If you are using any tobacco products, including cigarettes, chewing tobacco, and electronic cigarettes.  If you have any questions. Other tests or screenings that may be performed during your third trimester include:  Blood tests that check for low iron levels (anemia).  Fetal testing to check the health, activity level, and growth of the fetus. Testing is done if you have certain medical conditions or if there are problems during the pregnancy.  Nonstress test  (NST). This test checks the health of your baby to make sure there are no signs of problems, such as the baby not getting enough oxygen. During this test, a belt is placed around your belly. The baby is made to move, and its heart rate is monitored during movement. What is false labor? False labor is a condition in which you feel small, irregular tightenings of the muscles in the womb (contractions) that usually go away with rest, changing position, or drinking water. These are called Braxton Hicks contractions. Contractions may last for hours, days, or even weeks before true labor sets in. If contractions come at regular intervals, become more frequent, increase in intensity, or become painful, you should see your health care provider. What are the signs of labor?  Abdominal cramps.  Regular contractions that start at 10 minutes apart and become stronger and more frequent with time.  Contractions that start on the top of the uterus and spread down to the lower abdomen and back.  Increased pelvic pressure and dull back pain.  A watery or bloody mucus discharge that comes from the vagina.  Leaking of amniotic fluid. This is also known as your "water breaking." It could be a slow trickle or a gush. Let your health care provider know if it has a color or strange odor. If you have any of these signs, call your health care provider right away, even if it is before your due date. Follow these instructions at home: Medicines  Follow your health care provider's instructions regarding medicine use. Specific medicines may be either safe or unsafe to take during pregnancy.  Take a prenatal vitamin that contains at least 600 micrograms (mcg) of folic acid.  If you develop constipation, try taking a stool softener if your health care provider approves. Eating and drinking   Eat a balanced diet that includes fresh fruits and vegetables, whole grains, good sources of protein such as meat, eggs, or tofu,  and low-fat dairy. Your health care provider will help you determine the amount of weight gain that is right for you.  Avoid raw meat and uncooked cheese. These carry germs that can cause birth defects in the baby.  If you have low calcium intake from food, talk to your health care provider about whether you should take a daily calcium supplement.  Eat four or five small meals rather than three large meals a day.  Limit foods that are high in fat and processed sugars, such as fried and sweet foods.  To prevent constipation: ? Drink enough fluid to keep your urine clear or pale yellow. ? Eat foods that are high in fiber, such as fresh fruits and vegetables, whole grains, and beans. Activity  Exercise only as directed by your health care provider. Most women can continue their usual exercise routine during pregnancy. Try to exercise for 30 minutes at least 5 days a week. Stop exercising if you experience uterine contractions.  Avoid heavy lifting.  Do   not exercise in extreme heat or humidity, or at high altitudes.  Wear low-heel, comfortable shoes.  Practice good posture.  You may continue to have sex unless your health care provider tells you otherwise. Relieving pain and discomfort  Take frequent breaks and rest with your legs elevated if you have leg cramps or low back pain.  Take warm sitz baths to soothe any pain or discomfort caused by hemorrhoids. Use hemorrhoid cream if your health care provider approves.  Wear a good support bra to prevent discomfort from breast tenderness.  If you develop varicose veins: ? Wear support pantyhose or compression stockings as told by your healthcare provider. ? Elevate your feet for 15 minutes, 3-4 times a day. Prenatal care  Write down your questions. Take them to your prenatal visits.  Keep all your prenatal visits as told by your health care provider. This is important. Safety  Wear your seat belt at all times when driving.  Make  a list of emergency phone numbers, including numbers for family, friends, the hospital, and police and fire departments. General instructions  Avoid cat litter boxes and soil used by cats. These carry germs that can cause birth defects in the baby. If you have a cat, ask someone to clean the litter box for you.  Do not travel far distances unless it is absolutely necessary and only with the approval of your health care provider.  Do not use hot tubs, steam rooms, or saunas.  Do not drink alcohol.  Do not use any products that contain nicotine or tobacco, such as cigarettes and e-cigarettes. If you need help quitting, ask your health care provider.  Do not use any medicinal herbs or unprescribed drugs. These chemicals affect the formation and growth of the baby.  Do not douche or use tampons or scented sanitary pads.  Do not cross your legs for long periods of time.  To prepare for the arrival of your baby: ? Take prenatal classes to understand, practice, and ask questions about labor and delivery. ? Make a trial run to the hospital. ? Visit the hospital and tour the maternity area. ? Arrange for maternity or paternity leave through employers. ? Arrange for family and friends to take care of pets while you are in the hospital. ? Purchase a rear-facing car seat and make sure you know how to install it in your car. ? Pack your hospital bag. ? Prepare the baby's nursery. Make sure to remove all pillows and stuffed animals from the baby's crib to prevent suffocation.  Visit your dentist if you have not gone during your pregnancy. Use a soft toothbrush to brush your teeth and be gentle when you floss. Contact a health care provider if:  You are unsure if you are in labor or if your water has broken.  You become dizzy.  You have mild pelvic cramps, pelvic pressure, or nagging pain in your abdominal area.  You have lower back pain.  You have persistent nausea, vomiting, or  diarrhea.  You have an unusual or bad smelling vaginal discharge.  You have pain when you urinate. Get help right away if:  Your water breaks before 37 weeks.  You have regular contractions less than 5 minutes apart before 37 weeks.  You have a fever.  You are leaking fluid from your vagina.  You have spotting or bleeding from your vagina.  You have severe abdominal pain or cramping.  You have rapid weight loss or weight gain.  You have   shortness of breath with chest pain.  You notice sudden or extreme swelling of your face, hands, ankles, feet, or legs.  Your baby makes fewer than 10 movements in 2 hours.  You have severe headaches that do not go away when you take medicine.  You have vision changes. Summary  The third trimester is from week 28 through week 40, months 7 through 9. The third trimester is a time when the unborn baby (fetus) is growing rapidly.  During the third trimester, your discomfort may increase as you and your baby continue to gain weight. You may have abdominal, leg, and back pain, sleeping problems, and an increased need to urinate.  During the third trimester your breasts will keep growing and they will continue to become tender. A yellow fluid (colostrum) may leak from your breasts. This is the first milk you are producing for your baby.  False labor is a condition in which you feel small, irregular tightenings of the muscles in the womb (contractions) that eventually go away. These are called Braxton Hicks contractions. Contractions may last for hours, days, or even weeks before true labor sets in.  Signs of labor can include: abdominal cramps; regular contractions that start at 10 minutes apart and become stronger and more frequent with time; watery or bloody mucus discharge that comes from the vagina; increased pelvic pressure and dull back pain; and leaking of amniotic fluid. This information is not intended to replace advice given to you by your  health care provider. Make sure you discuss any questions you have with your health care provider. Document Revised: 12/04/2018 Document Reviewed: 09/18/2016 Elsevier Patient Education  2020 [employer] Inc[employer]  

## 2020-04-04 NOTE — Progress Notes (Addendum)
   PRENATAL VISIT NOTE  Subjective:  Latoya West is a 19 y.o. G1P0000 at [redacted]w[redacted]d being seen today for ongoing prenatal care.  She is currently monitored for the following issues for this low-risk pregnancy and has Oppositional defiant disorder; MDD (major depressive disorder); Suicide attempt by drug ingestion [facility]); MDD (major depressive disorder), recurrent severe, without psychosis (HCC); Encounter for supervision of normal first pregnancy in first trimester; Obesity affecting pregnancy in first trimester; Transient hypertension of pregnancy in first trimester; and Alpha thalassemia silent carrier on their problem list.  Patient reports no complaints.  Contractions: Not present. Vag. Bleeding: None.  Movement: Present. Denies leaking of fluid.   The following portions of the patient's history were reviewed and updated as appropriate: allergies, current medications, past family history, past medical history, past social history, past surgical history and problem list.   Objective:   Vitals:   04/04/20 0937  BP: 114/81  Pulse: 85  Temp: 99.5 F (37.5 C)  Weight: 247 lb 6.4 oz (112.2 kg)    Fetal Status: Fetal Heart Rate (bpm): 133 Fundal Height: 28 cm Movement: Present     General:  Alert, oriented and cooperative. Patient is in no acute distress.  Skin: Skin is warm and dry. No rash noted.   Cardiovascular: Normal heart rate noted  Respiratory: Normal respiratory effort, no problems with respiration noted  Abdomen: Soft, gravid, appropriate for gestational age.  Pain/Pressure: Absent     Pelvic: Cervical exam deferred        Extremities: Normal range of motion.  Edema: None  Mental Status: Normal mood and affect. Normal behavior. Normal judgment and thought content.   Assessment and Plan:  Pregnancy: G1P0000 at [redacted]w[redacted]d  1. Encounter for supervision of normal first pregnancy in first trimester --Anticipatory guidance about next visits/weeks of pregnancy given. --Next visit in 4  weeks, in person  - Glucose Tolerance, 2 Hours w/1 Hour - HIV Antibody (routine testing w rflx) - RPR - CBC  2. Alpha thalassemia silent carrier   3. Depression affecting pregnancy --Doing well, good support, visits with Sue Lush this pregnancy   4. Pain of round ligament affecting pregnancy, antepartum --Rest/ice/heat/warm bath/Tylenol/pregnancy support belt  - Elastic Bandages & Supports (COMFORT FIT MATERNITY SUPP MED) MISC; 1 Device by Does not apply route daily.  Dispense: 1 each; Refill: 0  5. [redacted] weeks gestation of pregnancy  Preterm labor symptoms and general obstetric precautions including but not limited to vaginal bleeding, contractions, leaking of fluid and fetal movement were reviewed in detail with the patient. Please refer to After Visit Summary for other counseling recommendations.   Return in about 4 weeks (around 05/02/2020).  Future Appointments  Date Time Provider Department Center  05/03/2020 10:30 AM [facility] NURSE [facility]  05/03/2020 10:45 AM [facility] US4 [facility]  05/03/2020  3:40 PM Raelyn Mora, CNM [facility] None    Sharen Counter, CNM

## 2020-04-04 NOTE — Progress Notes (Signed)
114/81 85 

## 2020-04-05 LAB — GLUCOSE TOLERANCE, 2 HOURS W/ 1HR
Glucose, 1 hour: 68 mg/dL (ref 65–179)
Glucose, 2 hour: 69 mg/dL (ref 65–152)
Glucose, Fasting: 72 mg/dL (ref 65–91)

## 2020-04-05 LAB — SYPHILIS: RPR W/REFLEX TO RPR TITER AND TREPONEMAL ANTIBODIES, TRADITIONAL SCREENING AND DIAGNOSIS ALGORITHM: RPR Ser Ql: NONREACTIVE

## 2020-04-05 LAB — CBC
Hematocrit: 33.5 % — ABNORMAL LOW (ref 34.0–46.6)
Hemoglobin: 10.7 g/dL — ABNORMAL LOW (ref 11.1–15.9)
MCH: 27.9 pg (ref 26.6–33.0)
MCHC: 31.9 g/dL (ref 31.5–35.7)
MCV: 88 fL (ref 79–97)
Platelets: 239 10*3/uL (ref 150–450)
RBC: 3.83 x10E6/uL (ref 3.77–5.28)
RDW: 12.6 % (ref 11.7–15.4)
WBC: 8.7 10*3/uL (ref 3.4–10.8)

## 2020-04-05 LAB — HIV ANTIBODY (ROUTINE TESTING W REFLEX): HIV Screen 4th Generation wRfx: NONREACTIVE

## 2020-05-03 ENCOUNTER — Ambulatory Visit: Payer: Medicaid Other | Admitting: *Deleted

## 2020-05-03 ENCOUNTER — Encounter: Payer: Self-pay | Admitting: *Deleted

## 2020-05-03 ENCOUNTER — Encounter: Payer: Medicaid Other | Admitting: Obstetrics and Gynecology

## 2020-05-03 ENCOUNTER — Ambulatory Visit: Payer: Medicaid Other | Attending: Obstetrics and Gynecology

## 2020-05-03 ENCOUNTER — Other Ambulatory Visit: Payer: Self-pay

## 2020-05-03 DIAGNOSIS — O99213 Obesity complicating pregnancy, third trimester: Secondary | ICD-10-CM | POA: Diagnosis not present

## 2020-05-03 DIAGNOSIS — O99210 Obesity complicating pregnancy, unspecified trimester: Secondary | ICD-10-CM | POA: Diagnosis present

## 2020-05-03 DIAGNOSIS — Z3401 Encounter for supervision of normal first pregnancy, first trimester: Secondary | ICD-10-CM

## 2020-05-03 DIAGNOSIS — O2693 Pregnancy related conditions, unspecified, third trimester: Secondary | ICD-10-CM

## 2020-05-03 DIAGNOSIS — Z3A32 32 weeks gestation of pregnancy: Secondary | ICD-10-CM

## 2020-05-03 DIAGNOSIS — E669 Obesity, unspecified: Secondary | ICD-10-CM | POA: Diagnosis not present

## 2020-05-03 DIAGNOSIS — Z362 Encounter for other antenatal screening follow-up: Secondary | ICD-10-CM | POA: Diagnosis not present

## 2020-05-03 DIAGNOSIS — Z148 Genetic carrier of other disease: Secondary | ICD-10-CM

## 2020-05-05 ENCOUNTER — Other Ambulatory Visit: Payer: Self-pay

## 2020-05-05 ENCOUNTER — Ambulatory Visit (INDEPENDENT_AMBULATORY_CARE_PROVIDER_SITE_OTHER): Payer: Medicaid Other

## 2020-05-05 VITALS — BP 90/62 | HR 70 | Wt 253.0 lb

## 2020-05-05 DIAGNOSIS — F329 Major depressive disorder, single episode, unspecified: Secondary | ICD-10-CM

## 2020-05-05 DIAGNOSIS — O99340 Other mental disorders complicating pregnancy, unspecified trimester: Secondary | ICD-10-CM

## 2020-05-05 DIAGNOSIS — Z23 Encounter for immunization: Secondary | ICD-10-CM | POA: Diagnosis not present

## 2020-05-05 DIAGNOSIS — Z3A32 32 weeks gestation of pregnancy: Secondary | ICD-10-CM | POA: Diagnosis not present

## 2020-05-05 DIAGNOSIS — Z3401 Encounter for supervision of normal first pregnancy, first trimester: Secondary | ICD-10-CM

## 2020-05-05 DIAGNOSIS — F32A Depression, unspecified: Secondary | ICD-10-CM

## 2020-05-05 NOTE — Progress Notes (Signed)
Pt presents for ROB  Tdap given today without difficulty  Flu vaccine declined

## 2020-05-05 NOTE — Progress Notes (Signed)
   LOW-RISK PREGNANCY OFFICE VISIT  Patient name: Latoya West MRN [id number]  Date of birth: 07/29/2001 Chief Complaint:   Routine Prenatal Visit  Subjective:   Latoya West is a 19 y.o. G35P0000 female at [redacted]w[redacted]d with an Estimated Date of Delivery: 06/25/20 being seen today for ongoing management of a low-risk pregnancy aeb has Oppositional defiant disorder; MDD (major depressive disorder); Suicide attempt by drug ingestion [facility]); MDD (major depressive disorder), recurrent severe, without psychosis (HCC); Encounter for supervision of normal first pregnancy in first trimester; Obesity affecting pregnancy in first trimester; Transient hypertension of pregnancy in first trimester; and Alpha thalassemia silent carrier on their problem list.  Patient presents today without complaints. Patient endorses fetal movement and some BH contractions.  She also reports a few incidents of "lightening crotch." Patient denies vaginal concerns including abnormal discharge, leaking of fluid, and bleeding. Patient plans to take infant to [facility].   Contractions: Not present. Vag. Bleeding: None.  Movement: Present.  Reviewed past medical,surgical, social, obstetrical and family history as well as problem list, medications and allergies.  Objective   Vitals:   05/05/20 1515  BP: 90/62  Pulse: 70  Weight: 253 lb (114.8 kg)  Body mass index is 40.22 kg/m.  Total Weight Gain:38 lb (17.2 kg)         Physical Examination:   General appearance: Well appearing, and in no distress  Mental status: Alert, oriented to person, place, and time  Skin: Warm & dry  Cardiovascular: Normal heart rate noted  Respiratory: Normal respiratory effort, no distress  Abdomen: Soft, gravid, nontender, AGA with Fundal height of Fundal Height: 33 cm  Pelvic: Cervical exam deferred           Extremities: Edema: None  Fetal Status: Fetal Heart Rate (bpm): 143  Movement: Present   No results found for this  or any previous visit (from the past 24 hour(s)).  Assessment & Plan:  Low-risk pregnancy of a 19 y.o., G1P0000 at [redacted]w[redacted]d with an Estimated Date of Delivery: 06/25/20   1. [redacted] weeks gestation of pregnancy -Reviewed concerns. -Reassured that BH contractions and vaginal pressure are normal in 3rd trimester.   2. Encounter for supervision of normal first pregnancy in first trimester -Anticipatory guidance for upcoming appts.  -Discussed and reviewed postpartum planning including contraception, pediatricians, and infant feeding. *Patient desires to breastfeed and does not desire PP for contraception.  3. Depression affecting pregnancy -Denies feelings of depression. -Denies HI/SI behaviors.     Meds: No orders of the defined types were placed in this encounter.  Labs/procedures today:  Lab Orders  No laboratory test(s) ordered today     Reviewed: Preterm labor symptoms and general obstetric precautions including but not limited to vaginal bleeding, contractions, leaking of fluid and fetal movement were reviewed in detail with the patient.  All questions were answered.  Follow-up: Return in about 2 weeks (around 05/19/2020) for Virtual LR-ROB.  Orders Placed This Encounter  Procedures  . Tdap vaccine greater than or equal to 7yo IM   Cherre Robins MSN, CNM 05/05/2020

## 2020-05-19 ENCOUNTER — Telehealth (INDEPENDENT_AMBULATORY_CARE_PROVIDER_SITE_OTHER): Payer: Medicaid Other | Admitting: Nurse Practitioner

## 2020-05-19 ENCOUNTER — Encounter: Payer: Self-pay | Admitting: Nurse Practitioner

## 2020-05-19 VITALS — BP 117/72 | HR 90

## 2020-05-19 DIAGNOSIS — Z3A34 34 weeks gestation of pregnancy: Secondary | ICD-10-CM

## 2020-05-19 DIAGNOSIS — Z3401 Encounter for supervision of normal first pregnancy, first trimester: Secondary | ICD-10-CM

## 2020-05-19 DIAGNOSIS — Z3403 Encounter for supervision of normal first pregnancy, third trimester: Secondary | ICD-10-CM

## 2020-05-19 NOTE — Progress Notes (Signed)
I connected with  Latoya West on 05/19/20 by a video enabled telemedicine application and verified that I am speaking with the correct person using two identifiers.  Patient is at home and I am at [facility]   I discussed the limitations of evaluation and management by telemedicine. The patient expressed understanding and agreed to proceed.  MyChart OB, reports no problem today.

## 2020-05-19 NOTE — Progress Notes (Signed)
OBSTETRICS PRENATAL VIRTUAL VISIT ENCOUNTER NOTE  Provider location: Center for [facility] [place]   I connected with Latoya West on 05/19/20 at  1:40 PM EDT by MyChart Video Encounter at home and verified that I am speaking with the correct person using two identifiers.   I discussed the limitations, risks, security and privacy concerns of performing an evaluation and management service virtually and the availability of in person appointments. I also discussed with the patient that there may be a patient responsible charge related to this service. The patient expressed understanding and agreed to proceed. Subjective:  Latoya West is a 19 y.o. G1P0000 at [redacted]w[redacted]d being seen today for ongoing prenatal care.  She is currently monitored for the following issues for this low-risk pregnancy and has Oppositional defiant disorder; MDD (major depressive disorder); Suicide attempt by drug ingestion [facility]); MDD (major depressive disorder), recurrent severe, without psychosis (HCC); Encounter for supervision of normal first pregnancy in first trimester; Obesity affecting pregnancy in first trimester; Transient hypertension of pregnancy in first trimester; and Alpha thalassemia silent carrier on their problem list.  Patient reports no complaints.  Contractions: Not present. Vag. Bleeding: None.  Movement: Present. Denies any leaking of fluid.   The following portions of the patient's history were reviewed and updated as appropriate: allergies, current medications, past family history, past medical history, past social history, past surgical history and problem list.   Objective:   Vitals:   05/19/20 1408  BP: 117/72  Pulse: 90    Fetal Status:     Movement: Present     General:  Alert, oriented and cooperative. Patient is in no acute distress.  Respiratory: Normal respiratory effort, no problems with respiration noted  Mental Status: Normal mood and affect. Normal behavior. Normal  judgment and thought content.  Rest of physical exam deferred due to type of encounter  Imaging: [place] MFM OB FOLLOW UP  Result Date: 05/03/2020 ----------------------------------------------------------------------  OBSTETRICS REPORT                       (Signed Final 05/03/2020 11:31 am) ---------------------------------------------------------------------- Patient Info  ID #:       [id number]                          D.O.B.:  2000/09/04 (18 yrs)  Name:       Latoya West                Visit Date: 05/03/2020 10:42 am ---------------------------------------------------------------------- Performed By  Attending:        Noralee Space MD        Ref. Address:     [address]                                                             [address]                                                             [employer]  Performed By:     Lenise Arena  [facility]  Referred By:      [facility]-                    Craige Cotta CNM ---------------------------------------------------------------------- Orders  #  Description                           Code        Ordered By  1  [place] MFM OB FOLLOW UP                   [phone]    Noralee Space ----------------------------------------------------------------------  #  Order #                     Accession #                Episode #  1  [id number]                   [id number]                 [id number] ---------------------------------------------------------------------- Indications  Encounter for other antenatal screening        Z36.2  follow-up  Obesity complicating pregnancy, second         O99.212  trimester  Genetic carrier (silent carrier for Alpha-     Z14.8  Thalassemia)  Low Risk NIPS (Normal AFP)  [redacted] weeks gestation of pregnancy                 Z3A.32  Medical complication of pregnancy (transient   O26.90  hypertension in first trimester) ---------------------------------------------------------------------- Vital Signs                                                 Height:        5'6" ---------------------------------------------------------------------- Fetal Evaluation  Num Of Fetuses:         1  Fetal Heart Rate(bpm):  134  Cardiac Activity:       Observed  Presentation:           Cephalic  Placenta:               Posterior  P.  Cord Insertion:      Previously Visualized  Amniotic Fluid  AFI FV:      Within normal limits  AFI Sum(cm)     %Tile       Largest Pocket(cm)  13.14           41          3.81  RUQ(cm)       RLQ(cm)       LUQ(cm)        LLQ(cm)  3.81          2.41          3.21           3.71 ---------------------------------------------------------------------- Biometry  BPD:      80.7  mm     G. Age:  32w 3d         41  %    CI:        75.33   %    70 - 86                                                          FL/HC:      20.7   %    19.1 - 21.3  HC:      294.9  mm     G. Age:  32w 4d         18  %    HC/AC:      1.07        0.96 - 1.17  AC:      276.4  mm     G. Age:  31w 5d         28  %    FL/BPD:     75.5   %    71 - 87  FL:       60.9  mm     G. Age:  31w 4d         18  %    FL/AC:      22.0   %    20 - 24  LV:        3.6  mm  Est. FW:    1846  gm      4 lb 1 oz     23  % ---------------------------------------------------------------------- OB History  Gravidity:    1         Term:   0        Prem:   0        SAB:   0  TOP:          0       Ectopic:  0        Living: 0 ---------------------------------------------------------------------- Gestational Age  LMP:           32w 3d        Date:  09/19/19                 EDD:   06/25/20  U/S Today:     32w 1d                                        EDD:  06/27/20  Best:          32w 3d     Det. By:  LMP  (09/19/19)          EDD:   06/25/20  ---------------------------------------------------------------------- Anatomy  Cranium:               Appears normal         LVOT:                   Previously seen  Cavum:                 Appears normal         Aortic Arch:            Previously seen  Ventricles:            Appears normal         Ductal Arch:            Previously seen  Choroid Plexus:        Previously seen        Diaphragm:              Appears normal  Cerebellum:            Previously seen        Stomach:                Appears normal, left                                                                        sided  Posterior Fossa:       Previously seen        Abdomen:                Appears normal  Nuchal Fold:           Previously seen        Abdominal Wall:         Previously seen  Face:                  Orbits and profile     Cord Vessels:           Previously seen                         previously seen  Lips:                  Previously seen        Kidneys:                Appear normal  Palate:                Previously seen        Bladder:                Appears normal  Thoracic:              Appears normal         Spine:                  Appears normal  Heart:  Appears normal; EIF    Upper Extremities:      Previously seen  RVOT:                  Previously seen        Lower Extremities:      Previously seen  Other:  Heels, right 5th digit, and Nasal bone previously visualized.          Technically difficult due to fetal position. ---------------------------------------------------------------------- Cervix Uterus Adnexa  Cervix  Not visualized (advanced GA >24wks) ---------------------------------------------------------------------- Impression  Patient had transient hypertension in the first trimester.  She  returned for fetal growth assessment.  She does not have  gestational diabetes.  Fetal growth is appropriate for gestational age .Amniotic fluid  is normal and good fetal activity is seen .  Blood pressure today  at our office is 112/65 mmHg.  We reassured the patient of the findings. ---------------------------------------------------------------------- Recommendations  Follow-up scans as clinically indicated. ----------------------------------------------------------------------                  Noralee Space, MD Electronically Signed Final Report   05/03/2020 11:31 am ----------------------------------------------------------------------   Assessment and Plan:  Pregnancy: G1P0000 at [redacted]w[redacted]d 1. Encounter for supervision of normal first pregnancy in first trimester Missed last appointment with Sue Lush for follow up with mood and depression evaluation.  States she is doing well and reinforced she can call the office any time if she is having any problems. Advised to sign up for classes and virtual tour. Given website to look for info on contraception.  Unsure if she wants anything.  Thinks the patch that she used last contributed heavily to weight gain.  Plans to be celibate.   Preterm labor symptoms and general obstetric precautions including but not limited to vaginal bleeding, contractions, leaking of fluid and fetal movement were reviewed in detail with the patient. I discussed the assessment and treatment plan with the patient. The patient was provided an opportunity to ask questions and all were answered. The patient agreed with the plan and demonstrated an understanding of the instructions. The patient was advised to call back or seek an in-person office evaluation/go to MAU at [facility] for any urgent or concerning symptoms. Please refer to After Visit Summary for other counseling recommendations.   I provided 8 minutes of face-to-face time during this encounter.  Return in about 2 weeks (around 06/02/2020) for in person ROB.  No future appointments. at time of close of chart.  Currie Paris, NP Center for [employer], [facility]

## 2020-05-19 NOTE — Patient Instructions (Addendum)
[facility] [email or web address] for virtual childbirth classes  [email or web address] for contraception for young women

## 2020-06-02 ENCOUNTER — Other Ambulatory Visit (HOSPITAL_COMMUNITY)
Admission: RE | Admit: 2020-06-02 | Discharge: 2020-06-02 | Disposition: A | Discharge status: Home / Self Care | Payer: Medicaid Other | Source: Ambulatory Visit | Attending: Obstetrics and Gynecology | Admitting: Obstetrics and Gynecology

## 2020-06-02 ENCOUNTER — Encounter: Payer: Self-pay | Admitting: Obstetrics

## 2020-06-02 ENCOUNTER — Other Ambulatory Visit: Payer: Self-pay

## 2020-06-02 ENCOUNTER — Ambulatory Visit (INDEPENDENT_AMBULATORY_CARE_PROVIDER_SITE_OTHER): Payer: Medicaid Other | Admitting: Obstetrics and Gynecology

## 2020-06-02 ENCOUNTER — Encounter: Payer: Self-pay | Admitting: Obstetrics and Gynecology

## 2020-06-02 VITALS — BP 97/65 | HR 99 | Wt 264.0 lb

## 2020-06-02 DIAGNOSIS — Z3A36 36 weeks gestation of pregnancy: Secondary | ICD-10-CM

## 2020-06-02 DIAGNOSIS — O99211 Obesity complicating pregnancy, first trimester: Secondary | ICD-10-CM

## 2020-06-02 DIAGNOSIS — Z3401 Encounter for supervision of normal first pregnancy, first trimester: Secondary | ICD-10-CM | POA: Insufficient documentation

## 2020-06-02 NOTE — Progress Notes (Signed)
   PRENATAL VISIT NOTE  Subjective:  Latoya West is a 19 y.o. G1P0000 at [redacted]w[redacted]d being seen today for ongoing prenatal care.  She is currently monitored for the following issues for this low-risk pregnancy and has Oppositional defiant disorder; MDD (major depressive disorder); Suicide attempt by drug ingestion [facility]); MDD (major depressive disorder), recurrent severe, without psychosis (HCC); Encounter for supervision of normal first pregnancy in first trimester; Obesity affecting pregnancy in first trimester; Transient hypertension of pregnancy in first trimester; and Alpha thalassemia silent carrier on their problem list.  Patient reports no complaints.  Contractions: Not present. Vag. Bleeding: None.  Movement: Present. Denies leaking of fluid.   The following portions of the patient's history were reviewed and updated as appropriate: allergies, current medications, past family history, past medical history, past social history, past surgical history and problem list.   Objective:   Vitals:   06/02/20 1408  BP: 97/65  Pulse: 99  Weight: 264 lb (119.7 kg)    Fetal Status: Fetal Heart Rate (bpm): 141 Fundal Height: 36 cm Movement: Present     General:  Alert, oriented and cooperative. Patient is in no acute distress.  Skin: Skin is warm and dry. No rash noted.   Cardiovascular: Normal heart rate noted  Respiratory: Normal respiratory effort, no problems with respiration noted  Abdomen: Soft, gravid, appropriate for gestational age.  Pain/Pressure: Present     Pelvic: Cervical exam performed in the presence of a chaperone Dilation: Closed Effacement (%): Thick Station: -3  Extremities: Normal range of motion.  Edema: Trace  Mental Status: Normal mood and affect. Normal behavior. Normal judgment and thought content.   Assessment and Plan:  Pregnancy: G1P0000 at [redacted]w[redacted]d 1. [redacted] weeks gestation of pregnancy   2. Encounter for supervision of normal first pregnancy in first  trimester Patient is doing well without complaints Cultures collected today Patient is not planning to use any birth control  3. Obesity affecting pregnancy in first trimester   Preterm labor symptoms and general obstetric precautions including but not limited to vaginal bleeding, contractions, leaking of fluid and fetal movement were reviewed in detail with the patient. Please refer to After Visit Summary for other counseling recommendations.   Return in about 1 week (around 06/09/2020) for in person, ROB, Low risk.  No future appointments.  Catalina Antigua, MD

## 2020-06-03 LAB — CERVICOVAGINAL ANCILLARY ONLY
Chlamydia: NEGATIVE
Comment: NEGATIVE
Comment: NORMAL
Neisseria Gonorrhea: NEGATIVE

## 2020-06-04 LAB — STREP GP B NAA: Strep Gp B NAA: NEGATIVE

## 2020-06-09 ENCOUNTER — Ambulatory Visit (INDEPENDENT_AMBULATORY_CARE_PROVIDER_SITE_OTHER): Payer: Medicaid Other

## 2020-06-09 ENCOUNTER — Other Ambulatory Visit: Payer: Self-pay

## 2020-06-09 VITALS — BP 108/71 | HR 91 | Wt 266.0 lb

## 2020-06-09 DIAGNOSIS — Z3401 Encounter for supervision of normal first pregnancy, first trimester: Secondary | ICD-10-CM

## 2020-06-09 DIAGNOSIS — O99340 Other mental disorders complicating pregnancy, unspecified trimester: Secondary | ICD-10-CM

## 2020-06-09 DIAGNOSIS — Z3A37 37 weeks gestation of pregnancy: Secondary | ICD-10-CM

## 2020-06-09 DIAGNOSIS — F32A Depression, unspecified: Secondary | ICD-10-CM

## 2020-06-09 NOTE — Progress Notes (Signed)
Pt presents for ROB without complaints today.  

## 2020-06-09 NOTE — Progress Notes (Signed)
   LOW-RISK PREGNANCY OFFICE VISIT  Patient name: Latoya West MRN [id number]  Date of birth: [birth year] Chief Complaint:   Routine Prenatal Visit  Subjective:   Latoya West is a 19 y.o. G29P0000 female at [redacted]w[redacted]d with an Estimated Date of Delivery: 06/25/20 being seen today for ongoing management of a low-risk pregnancy aeb has Oppositional defiant disorder; MDD (major depressive disorder); Suicide attempt by drug ingestion [facility]); MDD (major depressive disorder), recurrent severe, without psychosis (HCC); Encounter for supervision of normal first pregnancy in first trimester; Obesity affecting pregnancy in first trimester; Transient hypertension of pregnancy in first trimester; and Alpha thalassemia silent carrier on their problem list.  Patient presents today without complaints. Patient endorses fetal movement and denies abdominal cramping or contractions.  Patient denies vaginal concerns including abnormal discharge, leaking of fluid, and bleeding. She reports that she has not had any issues with depression.  Contractions: Not present. Vag. Bleeding: None.  Movement: Present.  Reviewed past medical,surgical, social, obstetrical and family history as well as problem list, medications and allergies.  Objective   Vitals:   06/09/20 0850  BP: 108/71  Pulse: 91  Weight: 266 lb (120.7 kg)  Body mass index is 42.29 kg/m.  Total Weight Gain:51 lb (23.1 kg)         Physical Examination:   General appearance: Well appearing, and in no distress  Mental status: Alert, oriented to person, place, and time  Skin: Warm & dry  Cardiovascular: Normal heart rate noted  Respiratory: Normal respiratory effort, no distress  Abdomen: Soft, gravid, nontender, AGA with Fundal height of Fundal Height: 37 cm  Pelvic: Cervical exam deferred           Extremities: Edema: Trace  Fetal Status: Fetal Heart Rate (bpm): 138  Movement: Present   No results found for this or any previous visit (from the  past 24 hour(s)).  Assessment & Plan:  Low-risk pregnancy of a 19 y.o., G1P0000 at [redacted]w[redacted]d with an Estimated Date of Delivery: 06/25/20   1. Encounter for supervision of normal first pregnancy in first trimester -Discussed and reviewed postpartum planning including contraception, pediatricians, and infant feedings. *Patient desires to breastfeed and is not interested in contraception.  2. [redacted] weeks gestation of pregnancy -Anticipatory guidance for upcoming appts. -Discussed next appt as virtual visit and will plan to set up IOL at that time.   3. Depression affecting pregnancy -Will plan for depression check one week after delivery. -Encouraged to contact LCSW-Andrea if needed.   Meds: No orders of the defined types were placed in this encounter.  Labs/procedures today:  Lab Orders  No laboratory test(s) ordered today     Reviewed: Term labor symptoms and general obstetric precautions including but not limited to vaginal bleeding, contractions, leaking of fluid and fetal movement were reviewed in detail with the patient.  All questions were answered.  Follow-up: Return in about 1 week (around 06/16/2020) for Virtual LR-ROB.  No orders of the defined types were placed in this encounter.  Cherre Robins MSN, CNM 06/09/2020

## 2020-06-09 NOTE — Patient Instructions (Signed)

## 2020-06-16 ENCOUNTER — Encounter: Payer: Self-pay | Admitting: Obstetrics

## 2020-06-16 ENCOUNTER — Telehealth (INDEPENDENT_AMBULATORY_CARE_PROVIDER_SITE_OTHER): Payer: Medicaid Other | Admitting: Obstetrics

## 2020-06-16 DIAGNOSIS — Z3A38 38 weeks gestation of pregnancy: Secondary | ICD-10-CM

## 2020-06-16 DIAGNOSIS — O99211 Obesity complicating pregnancy, first trimester: Secondary | ICD-10-CM

## 2020-06-16 DIAGNOSIS — Z3401 Encounter for supervision of normal first pregnancy, first trimester: Secondary | ICD-10-CM

## 2020-06-16 DIAGNOSIS — O99213 Obesity complicating pregnancy, third trimester: Secondary | ICD-10-CM

## 2020-06-16 DIAGNOSIS — E669 Obesity, unspecified: Secondary | ICD-10-CM

## 2020-06-16 DIAGNOSIS — F32A Depression, unspecified: Secondary | ICD-10-CM

## 2020-06-16 DIAGNOSIS — O99340 Other mental disorders complicating pregnancy, unspecified trimester: Secondary | ICD-10-CM

## 2020-06-16 NOTE — Progress Notes (Signed)
° °  OBSTETRICS PRENATAL VIRTUAL VISIT ENCOUNTER NOTE  Provider location: Center for [facility] [place]   I connected with Ralene Muskrat on 06/16/20 at 11:15 AM EDT by MyChart Video Encounter at home and verified that I am speaking with the correct person using two identifiers.   I discussed the limitations, risks, security and privacy concerns of performing an evaluation and management service virtually and the availability of in person appointments. I also discussed with the patient that there may be a patient responsible charge related to this service. The patient expressed understanding and agreed to proceed. Subjective:  ADDALEE KAVANAGH is a 19 y.o. G1P0000 at [redacted]w[redacted]d being seen today for ongoing prenatal care.  She is currently monitored for the following issues for this low-risk pregnancy and has Oppositional defiant disorder; MDD (major depressive disorder); Suicide attempt by drug ingestion [facility]); MDD (major depressive disorder), recurrent severe, without psychosis (HCC); Encounter for supervision of normal first pregnancy in first trimester; Obesity affecting pregnancy in first trimester; Transient hypertension of pregnancy in first trimester; and Alpha thalassemia silent carrier on their problem list.  Patient reports no complaints.  Contractions: Not present. Vag. Bleeding: None.  Movement: Present. Denies any leaking of fluid.   The following portions of the patient's history were reviewed and updated as appropriate: allergies, current medications, past family history, past medical history, past social history, past surgical history and problem list.   Objective:  There were no vitals filed for this visit.  Fetal Status:     Movement: Present     General:  Alert, oriented and cooperative. Patient is in no acute distress.  Respiratory: Normal respiratory effort, no problems with respiration noted  Mental Status: Normal mood and affect. Normal behavior. Normal judgment and  thought content.  Rest of physical exam deferred due to type of encounter  Imaging: No results found.  Assessment and Plan:  Pregnancy: G1P0000 at [redacted]w[redacted]d 1. Encounter for supervision of normal first pregnancy in first trimester  2. Depression affecting pregnancy - clinically stable  3. Obesity affecting pregnancy in first trimester   Term labor symptoms and general obstetric precautions including but not limited to vaginal bleeding, contractions, leaking of fluid and fetal movement were reviewed in detail with the patient. I discussed the assessment and treatment plan with the patient. The patient was provided an opportunity to ask questions and all were answered. The patient agreed with the plan and demonstrated an understanding of the instructions. The patient was advised to call back or seek an in-person office evaluation/go to MAU at [facility] for any urgent or concerning symptoms. Please refer to After Visit Summary for other counseling recommendations.   I provided 10 minutes of face-to-face time during this encounter.  Return in about 1 week (around 06/23/2020) for ROB.  Coral Ceo, MD Center for [facility], [facility] 06/16/20

## 2020-06-28 ENCOUNTER — Ambulatory Visit (INDEPENDENT_AMBULATORY_CARE_PROVIDER_SITE_OTHER): Payer: Medicaid Other | Admitting: Advanced Practice Midwife

## 2020-06-28 ENCOUNTER — Other Ambulatory Visit: Payer: Self-pay

## 2020-06-28 ENCOUNTER — Other Ambulatory Visit: Payer: Self-pay | Admitting: Advanced Practice Midwife

## 2020-06-28 ENCOUNTER — Other Ambulatory Visit (HOSPITAL_COMMUNITY)
Admission: RE | Admit: 2020-06-28 | Discharge: 2020-06-28 | Disposition: A | Discharge status: Home / Self Care | Payer: Medicaid Other | Source: Ambulatory Visit | Attending: Advanced Practice Midwife | Admitting: Advanced Practice Midwife

## 2020-06-28 VITALS — BP 109/73 | HR 88 | Wt 266.0 lb

## 2020-06-28 DIAGNOSIS — O26893 Other specified pregnancy related conditions, third trimester: Secondary | ICD-10-CM

## 2020-06-28 DIAGNOSIS — N898 Other specified noninflammatory disorders of vagina: Secondary | ICD-10-CM | POA: Diagnosis present

## 2020-06-28 DIAGNOSIS — Z3A40 40 weeks gestation of pregnancy: Secondary | ICD-10-CM

## 2020-06-28 DIAGNOSIS — Z3401 Encounter for supervision of normal first pregnancy, first trimester: Secondary | ICD-10-CM

## 2020-06-28 DIAGNOSIS — F32A Depression, unspecified: Secondary | ICD-10-CM

## 2020-06-28 DIAGNOSIS — O480 Post-term pregnancy: Secondary | ICD-10-CM | POA: Diagnosis not present

## 2020-06-28 DIAGNOSIS — O99340 Other mental disorders complicating pregnancy, unspecified trimester: Secondary | ICD-10-CM

## 2020-06-28 NOTE — Patient Instructions (Signed)
Things to Try After 37 weeks to Encourage Labor/Get Ready for Labor:   1.  Try the Miles Circuit at www.milescircuit.com daily to improve baby's position and encourage the onset of labor.  2. Walk a little and rest a little every day.  Change positions often.  3. Cervical Ripening: May try one or both a. Red Raspberry Leaf capsules or tea:  two 300mg or 400mg tablets with each meal, 2-3 times a day, or 1-3 cups of tea daily  Potential Side Effects Of Raspberry Leaf:  Most women do not experience any side effects from drinking raspberry leaf tea. However, nausea and loose stools are possible   b. Evening Primrose Oil capsules: may take 1 to 3 capsules daily. Take 1-2 capsules by mouth each day and place one capsule vaginally at night.  You may also prick the vaginal capsule to release the oil prior to inserting in the vagina. Some of the potential side effects:  Upset stomach  Loose stools or diarrhea  Headaches  Nausea  4. Sex (and especially sex with orgasm) can also help the cervix ripen and encourage labor onset.  Labor Precautions Reasons to come to MAU at [facility]:  1.  Contractions are  5 minutes apart or less, each last 1 minute, these have been going on for 1-2 hours, and you cannot walk or talk during them 2.  You have a large gush of fluid, or a trickle of fluid that will not stop and you have to wear a pad 3.  You have bleeding that is bright red, heavier than spotting--like menstrual bleeding (spotting can be normal in early labor or after a check of your cervix) 4.  You do not feel the baby moving like he/she normally does 

## 2020-06-28 NOTE — Progress Notes (Signed)
   PRENATAL VISIT NOTE  Subjective:  Latoya West is a 19 y.o. G1P0000 at [redacted]w[redacted]d being seen today for ongoing prenatal care.  She is currently monitored for the following issues for this low-risk pregnancy and has Oppositional defiant disorder; MDD (major depressive disorder); Suicide attempt by drug ingestion [facility]); MDD (major depressive disorder), recurrent severe, without psychosis (HCC); Encounter for supervision of normal first pregnancy in first trimester; Obesity affecting pregnancy in first trimester; Transient hypertension of pregnancy in first trimester; and Alpha thalassemia silent carrier on their problem list.  Patient reports milky discharge x 1-2 days.  Contractions: Not present. Vag. Bleeding: None.  Movement: Present. Denies leaking of fluid.   The following portions of the patient's history were reviewed and updated as appropriate: allergies, current medications, past family history, past medical history, past social history, past surgical history and problem list.   Objective:   Vitals:   06/28/20 1508  BP: 109/73  Pulse: 88  Weight: 266 lb (120.7 kg)    Fetal Status: Fetal Heart Rate (bpm): NST-R Fundal Height: 38 cm Movement: Present  Presentation: Vertex  General:  Alert, oriented and cooperative. Patient is in no acute distress.  Skin: Skin is warm and dry. No rash noted.   Cardiovascular: Normal heart rate noted  Respiratory: Normal respiratory effort, no problems with respiration noted  Abdomen: Soft, gravid, appropriate for gestational age.  Pain/Pressure: Present     Pelvic: Cervical exam performed in the presence of a chaperone Dilation: 1 Effacement (%): 40 Station: -3  Extremities: Normal range of motion.  Edema: Trace  Mental Status: Normal mood and affect. Normal behavior. Normal judgment and thought content.   Assessment and Plan:  Pregnancy: G1P0000 at [redacted]w[redacted]d 1. Encounter for supervision of normal first pregnancy in first trimester --Anticipatory  guidance given --Membranes swept on exam today on pt request --Labor readiness, including the [employer] reviewed with pt --IOL at 41 weeks, scheduled, orders placed   2. Depression affecting pregnancy --Doing well, stable  3. Post term pregnancy over 40 weeks --Reactive NST today   4. [redacted] weeks gestation of pregnancy   5. Vaginal discharge during pregnancy in third trimester --Leaking of milky fluid x 1-2 days --SSE with negative pooling with valsalva, slide taken and negative, equivocal or faintly positive nitrazine test.  Given 2 negative tests, no clear evidence of ROM today.  Precautions/reasons to seek care reviewed.   --Cervicovaginal swab collected today.  Negative at 36 weeks. - Cervicovaginal ancillary only( [place])  Term labor symptoms and general obstetric precautions including but not limited to vaginal bleeding, contractions, leaking of fluid and fetal movement were reviewed in detail with the patient. Please refer to After Visit Summary for other counseling recommendations.   No follow-ups on file.  No future appointments.  Sharen Counter, CNM

## 2020-06-28 NOTE — Progress Notes (Signed)
Pt presents for ROB c/o increased "milky" vagina discharge, denies odor, has irritation vulva area.  IOL forms completed today

## 2020-06-29 ENCOUNTER — Telehealth (HOSPITAL_COMMUNITY): Payer: Self-pay | Admitting: *Deleted

## 2020-06-29 LAB — CERVICOVAGINAL ANCILLARY ONLY
Bacterial Vaginitis (gardnerella): POSITIVE — AB
Candida Glabrata: NEGATIVE
Candida Vaginitis: POSITIVE — AB
Comment: NEGATIVE
Comment: NEGATIVE
Comment: NEGATIVE

## 2020-06-29 NOTE — Telephone Encounter (Signed)
Preadmission screen  

## 2020-06-30 ENCOUNTER — Other Ambulatory Visit (HOSPITAL_COMMUNITY)
Admission: RE | Admit: 2020-06-30 | Discharge: 2020-06-30 | Disposition: A | Discharge status: Home / Self Care | Payer: Medicaid Other | Source: Ambulatory Visit | Attending: Obstetrics and Gynecology | Admitting: Obstetrics and Gynecology

## 2020-06-30 DIAGNOSIS — Z01812 Encounter for preprocedural laboratory examination: Secondary | ICD-10-CM | POA: Diagnosis not present

## 2020-06-30 DIAGNOSIS — Z20822 Contact with and (suspected) exposure to covid-19: Secondary | ICD-10-CM | POA: Diagnosis not present

## 2020-06-30 LAB — SARS CORONAVIRUS 2 (TAT 6-24 HRS): SARS Coronavirus 2: NEGATIVE

## 2020-07-02 ENCOUNTER — Inpatient Hospital Stay (HOSPITAL_COMMUNITY): Payer: Medicaid Other

## 2020-07-02 ENCOUNTER — Inpatient Hospital Stay (HOSPITAL_COMMUNITY)
Admission: AD | Admit: 2020-07-02 | Discharge: 2020-07-05 | DRG: 787 | Disposition: A | Discharge status: Home / Self Care | Payer: Medicaid Other | Attending: Obstetrics and Gynecology | Admitting: Obstetrics and Gynecology

## 2020-07-02 DIAGNOSIS — D563 Thalassemia minor: Secondary | ICD-10-CM | POA: Diagnosis present

## 2020-07-02 DIAGNOSIS — O134 Gestational [pregnancy-induced] hypertension without significant proteinuria, complicating childbirth: Secondary | ICD-10-CM | POA: Diagnosis present

## 2020-07-02 DIAGNOSIS — O9081 Anemia of the puerperium: Secondary | ICD-10-CM | POA: Diagnosis not present

## 2020-07-02 DIAGNOSIS — Z3A41 41 weeks gestation of pregnancy: Secondary | ICD-10-CM

## 2020-07-02 DIAGNOSIS — F332 Major depressive disorder, recurrent severe without psychotic features: Secondary | ICD-10-CM | POA: Diagnosis present

## 2020-07-02 DIAGNOSIS — O99214 Obesity complicating childbirth: Secondary | ICD-10-CM | POA: Diagnosis present

## 2020-07-02 DIAGNOSIS — O480 Post-term pregnancy: Secondary | ICD-10-CM | POA: Diagnosis present

## 2020-07-02 DIAGNOSIS — O328XX0 Maternal care for other malpresentation of fetus, not applicable or unspecified: Secondary | ICD-10-CM | POA: Diagnosis not present

## 2020-07-02 DIAGNOSIS — Z98891 History of uterine scar from previous surgery: Secondary | ICD-10-CM | POA: Diagnosis not present

## 2020-07-02 DIAGNOSIS — D62 Acute posthemorrhagic anemia: Secondary | ICD-10-CM | POA: Diagnosis not present

## 2020-07-02 DIAGNOSIS — O131 Gestational [pregnancy-induced] hypertension without significant proteinuria, first trimester: Secondary | ICD-10-CM | POA: Diagnosis present

## 2020-07-02 DIAGNOSIS — T50902A Poisoning by unspecified drugs, medicaments and biological substances, intentional self-harm, initial encounter: Secondary | ICD-10-CM | POA: Diagnosis present

## 2020-07-02 DIAGNOSIS — F329 Major depressive disorder, single episode, unspecified: Secondary | ICD-10-CM | POA: Diagnosis present

## 2020-07-02 LAB — CBC
HCT: 33.9 % — ABNORMAL LOW (ref 36.0–46.0)
Hemoglobin: 10.8 g/dL — ABNORMAL LOW (ref 12.0–15.0)
MCH: 28.3 pg (ref 26.0–34.0)
MCHC: 31.9 g/dL (ref 30.0–36.0)
MCV: 89 fL (ref 80.0–100.0)
Platelets: 265 10*3/uL (ref 150–400)
RBC: 3.81 MIL/uL — ABNORMAL LOW (ref 3.87–5.11)
RDW: 14 % (ref 11.5–15.5)
WBC: 7.7 10*3/uL (ref 4.0–10.5)
nRBC: 0 % (ref 0.0–0.2)

## 2020-07-02 LAB — TYPE AND SCREEN
ABO/RH(D): A POS
Antibody Screen: NEGATIVE

## 2020-07-02 MED ORDER — OXYTOCIN BOLUS FROM INFUSION: 333.0000 mL | Freq: Once | INTRAVENOUS | Status: DC

## 2020-07-02 MED ORDER — SOD CITRATE-CITRIC ACID 500-334 MG/5ML PO SOLN
30.0000 mL | ORAL | Status: DC | PRN
  Filled 2020-07-02 (×2): qty 15

## 2020-07-02 MED ORDER — MISOPROSTOL 50MCG HALF TABLET
50.0000 ug | ORAL_TABLET | ORAL | Status: DC
  Administered 2020-07-02: 50 ug via ORAL
  Filled 2020-07-02: qty 1

## 2020-07-02 MED ORDER — LACTATED RINGERS IV SOLN: INTRAVENOUS | Status: DC

## 2020-07-02 MED ORDER — MISOPROSTOL 50MCG HALF TABLET
ORAL_TABLET | ORAL | Status: AC
  Administered 2020-07-03: 50 ug via BUCCAL
  Filled 2020-07-02: qty 1

## 2020-07-02 MED ORDER — OXYCODONE-ACETAMINOPHEN 5-325 MG PO TABS: 1.0000 | ORAL_TABLET | ORAL | Status: DC | PRN

## 2020-07-02 MED ORDER — ACETAMINOPHEN 325 MG PO TABS: 650.0000 mg | ORAL_TABLET | ORAL | Status: DC | PRN

## 2020-07-02 MED ORDER — LIDOCAINE HCL (PF) 1 % IJ SOLN: 30.0000 mL | INTRAMUSCULAR | Status: DC | PRN

## 2020-07-02 MED ORDER — OXYTOCIN-SODIUM CHLORIDE 30-0.9 UT/500ML-% IV SOLN: 2.5000 [IU]/h | INTRAVENOUS | Status: DC

## 2020-07-02 MED ORDER — MISOPROSTOL 50MCG HALF TABLET
50.0000 ug | ORAL_TABLET | ORAL | Status: DC
  Administered 2020-07-02: 50 ug via BUCCAL
  Filled 2020-07-02: qty 1

## 2020-07-02 MED ORDER — OXYCODONE-ACETAMINOPHEN 5-325 MG PO TABS: 2.0000 | ORAL_TABLET | ORAL | Status: DC | PRN

## 2020-07-02 MED ORDER — TERBUTALINE SULFATE 1 MG/ML IJ SOLN
0.2500 mg | Freq: Once | INTRAMUSCULAR | Status: AC | PRN
  Administered 2020-07-03: 0.25 mg via SUBCUTANEOUS
  Filled 2020-07-02: qty 1

## 2020-07-02 MED ORDER — ONDANSETRON HCL 4 MG/2ML IJ SOLN: 4.0000 mg | Freq: Four times a day (QID) | INTRAMUSCULAR | Status: DC | PRN

## 2020-07-02 MED ORDER — LACTATED RINGERS IV SOLN: 500.0000 mL | INTRAVENOUS | Status: DC | PRN

## 2020-07-02 NOTE — H&P (Addendum)
OBSTETRIC ADMISSION HISTORY AND PHYSICAL  Latoya West is a 19 y.o. female G1P0000 with IUP at [redacted]w[redacted]d by LMP presenting for IOL for Post dates. She reports +FMs, No LOF, no VB, no blurry vision, headaches or peripheral edema, and RUQ pain.  She plans on breast feeding. She declines birth control. She received her prenatal care at [facility]  Dating: By LMP --->  Estimated Date of Delivery: 06/25/20  Sono:    @[redacted]w[redacted]d , CWD, normal anatomy, cephalic, posterior placenta, , 23% EFW   Prenatal History/Complications:  -history of severe depression, not on meds. Saw BH during pregnancy -BMI 44    Past Medical History: Past Medical History:  Diagnosis Date  . Anxiety   . Depression   . Mental disorder   . Multiple allergies    has an epi pen for this  . Obesity, morbid [facility])     Past Surgical History: Past Surgical History:  Procedure Laterality Date  . LYMPHADENECTOMY    . NECK SURGERY      Obstetrical History: OB History    Gravida  1   Para  0   Term  0   Preterm  0   AB  0   Living  0     SAB  0   TAB  0   Ectopic  0   Multiple  0   Live Births  0           Social History Social History   Socioeconomic History  . Marital status: Single    Spouse name: Not on file  . Number of children: Not on file  . Years of education: Not on file  . Highest education level: Not on file  Occupational History  . Not on file  Tobacco Use  . Smoking status: Never Smoker  . Smokeless tobacco: Never Used  Vaping Use  . Vaping Use: Never used  Substance and Sexual Activity  . Alcohol use: No  . Drug use: Yes    Types: Marijuana  . Sexual activity: Yes    Partners: Male    Birth control/protection: None  Other Topics Concern  . Not on file  Social History Narrative  . Not on file   Social Determinants of Health   Financial Resource Strain:   . Difficulty of Paying Living Expenses: Not on file  Food Insecurity:   . Worried About [facility] in the Last Year: Not on file  . Ran Out of Food in the Last Year: Not on file  Transportation Needs:   . Lack of Transportation (Medical): Not on file  . Lack of Transportation (Non-Medical): Not on file  Physical Activity:   . Days of Exercise per Week: Not on file  . Minutes of Exercise per Session: Not on file  Stress:   . Feeling of Stress : Not on file  Social Connections:   . Frequency of Communication with Friends and Family: Not on file  . Frequency of Social Gatherings with Friends and Family: Not on file  . Attends Religious Services: Not on file  . Active Member of Clubs or Organizations: Not on file  . Attends Brewing techn[employer] Meetings: Not on file  . Marital Status: Not on file    Family History: Family History  Problem Relation Age of Onset  . Arthritis Mother     Allergies: Allergies  Allergen Reactions  . Kiwi Extract Swelling and Rash    Medications Prior to Admission  Medication  Sig Dispense Refill Last Dose  . Prenatal Vit-Fe Fumarate-FA (MULTIVITAMIN-PRENATAL) 27-0.8 MG TABS tablet Take 1 tablet by mouth daily at 12 noon.   07/02/2020 at Unknown time  . aspirin EC 81 MG tablet Take 1 tablet (81 mg total) by mouth daily. (Patient not taking: Reported on 03/07/2020) 30 tablet 5   . Elastic Bandages & Supports (COMFORT FIT MATERNITY SUPP MED) MISC 1 Device by Does not apply route daily. (Patient not taking: Reported on 05/05/2020) 1 each 0   . EPINEPHrine (EPIPEN 2-PAK) 0.3 mg/0.3 mL IJ SOAJ injection Inject 0.3 mg into the muscle once as needed (for anaphylactic or severe allergic reaction).  (Patient not taking: Reported on 05/05/2020)        Review of Systems   All systems reviewed and negative except as stated in HPI  Height 5' 6.5" (1.689 m), weight 126.7 kg, last menstrual period 09/19/2019. General appearance: alert, cooperative and appears stated age Lungs: normal respiratory effort, no respiratory distress Heart: regular rate Abdomen:  soft, non-tender Extremities: non tender, no sign of DVT, no BLE edema Presentation: cephalic by US Fetal monitoringBaseline: 130 bpm, Variability: Good {> 6 bpm), Accelerations: Reactive and Decelerations: Absent Uterine activity irregular, periods of q3-5 min Dilation: 1 Effacement (%): Thick Station: -3 Exam by:: Dr. Larita Fife (ultrasound)   Prenatal labs: ABO, Rh: --/--/PENDING (11/06 1400) Antibody: PENDING (11/06 1400) Rubella: 2.73 (04/26 1259) RPR: Non Reactive (08/09 1011)  HBsAg: Negative (04/26 1259)  HIV: Non Reactive (08/09 1011)  GBS: Negative/-- (10/07 0229)  2 hr Glucola normal Genetic screening  normal Anatomy US intracardiac echogenic focus seen on anatomy, subsequently resolved   Prenatal Transfer Tool  Maternal Diabetes: No Genetic Screening: Normal Maternal Ultrasounds/Referrals: Normal Fetal Ultrasounds or other Referrals:  None Maternal Substance Abuse:  No Significant Maternal Medications:  None Significant Maternal Lab Results: Group B Strep negative  Results for orders placed or performed during the hospital encounter of 07/02/20 (from the past 24 hour(s))  CBC   Collection Time: 07/02/20  2:00 PM  Result Value Ref Range   WBC 7.7 4.0 - 10.5 K/uL   RBC 3.81 (L) 3.87 - 5.11 MIL/uL   Hemoglobin 10.8 (L) 12.0 - 15.0 g/dL   HCT 99.8 (L) 36 - 46 %   MCV 89.0 80.0 - 100.0 fL   MCH 28.3 26.0 - 34.0 pg   MCHC 31.9 30.0 - 36.0 g/dL   RDW 33.8 25.0 - 53.9 %   Platelets 265 150 - 400 K/uL   nRBC 0.0 0.0 - 0.2 %  Type and screen   Collection Time: 07/02/20  2:00 PM  Result Value Ref Range   ABO/RH(D) PENDING    Antibody Screen PENDING    Sample Expiration      07/05/2020,2359 Performed at [facility], [address], [place], [place] [address]     Patient Active Problem List   Diagnosis Date Noted  . Post term pregnancy at [redacted] weeks gestation 07/02/2020  . Alpha thalassemia silent carrier 04/04/2020  . Transient hypertension of pregnancy in  first trimester 02/15/2020  . Obesity affecting pregnancy in first trimester 12/21/2019  . Encounter for supervision of normal first pregnancy in first trimester 12/09/2019  . Suicide attempt by drug ingestion (HCC) 07/08/2018  . 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 MDD (major depressive disorder) 07/07/2018  . Oppositional defiant disorder 11/05/2013    Assessment/Plan:  Latoya West is a 19 y.o. G1P0000 at [redacted]w[redacted]d here  for IOL for post dates  #Induction of Labor: Admission cervical exam fingertip/thick/-3, fetal tracing category 1. Will start buccal cytotec and reassess in 4 hours. Current cervical exam unfavorable for FB. Will hopefully be able to place FB at next check.  #History of depression: Will need post partum follow up and PP SW consult.  #Pain: Declines at this time. If desired, will use codeword "watermelon" per birthing plan. #FWB: Cat I  #ID:  GBS neg  #MOF: breast #MOC: declines #Circ:  n/a   #Detailed birth plan in room: Essentially wants low-intervention labor and birth (within reason) with conversational consent before each step of augmentation. Desires no pain meds because she would like to push on hands-and-knees or squatting with bar assistance. Also desires water therapy/shower during labor. Would like to feel fetal scalp when crowning.   Fayette Pho, MD  07/02/2020, 3:07 PM  I saw and evaluated the patient. I agree with the findings and the plan of care as documented in the resident's note.  Casper Harrison, MD [facility] Fellow, [facility] for [facility], [facility]

## 2020-07-02 NOTE — Progress Notes (Signed)
Labor Progress Note Latoya West is a 19 y.o. G1P0000 at [redacted]w[redacted]d presented for PD IOL.   S: Doing well. Sitting up in bed playing cards with her mother and eating snacks. Reports some mild cramping "like period cramps". No complaints.   O:  BP 107/67   Pulse 86   Temp 98.6 F (37 C) (Oral)   Resp 16   Ht 5' 6.5" (1.689 m)   Wt 126.7 kg   LMP 09/19/2019   BMI 44.42 kg/m  EFM: baseline 135 bpm / moderate variability / accels present, no decels Toco: irregular  CVE: Dilation: 1 Effacement (%): Thick Station: -2, -1 Presentation: Vertex Exam by:: Dr. Larita Fife   A&P: 19 y.o. G1P0000 [redacted]w[redacted]d presented for PD IOL.  #Labor: Progressing well. After discussion, declines foley bulb at this time. Would like to take another cytotec and move around out of bed, like bouncing on a ball. Will place orders for intermittent auscultation while she is bouncing or in shower.  #Pain: Declines pain medication at this time.   #FWB: Cat 1 #GBS negative #Severe MDD, not on meds: Plan for PP SW consult #BMI 44: External toco adequate at this time.  #Chlamydia in first trimester: Negative test in October, strongly recommend neonatal erythromycin ointment after delivery.   Fayette Pho, MD 7:13 PM

## 2020-07-03 ENCOUNTER — Inpatient Hospital Stay (HOSPITAL_COMMUNITY): Payer: Medicaid Other | Admitting: Anesthesiology

## 2020-07-03 ENCOUNTER — Encounter (HOSPITAL_COMMUNITY): Payer: Self-pay | Admitting: Family Medicine

## 2020-07-03 ENCOUNTER — Encounter (HOSPITAL_COMMUNITY)
Admission: AD | Disposition: A | Discharge status: Home / Self Care | Payer: Self-pay | Source: Home / Self Care | Attending: Obstetrics and Gynecology

## 2020-07-03 DIAGNOSIS — Z98891 History of uterine scar from previous surgery: Secondary | ICD-10-CM | POA: Diagnosis not present

## 2020-07-03 DIAGNOSIS — O328XX0 Maternal care for other malpresentation of fetus, not applicable or unspecified: Secondary | ICD-10-CM

## 2020-07-03 LAB — RPR: RPR Ser Ql: NONREACTIVE

## 2020-07-03 SURGERY — Surgical Case
Anesthesia: Spinal

## 2020-07-03 MED ORDER — ACETAMINOPHEN 500 MG PO TABS
1000.0000 mg | ORAL_TABLET | Freq: Four times a day (QID) | ORAL | Status: DC
  Filled 2020-07-03: qty 2

## 2020-07-03 MED ORDER — DIBUCAINE (PERIANAL) 1 % EX OINT: 1.0000 "application " | TOPICAL_OINTMENT | CUTANEOUS | Status: DC | PRN

## 2020-07-03 MED ORDER — TETANUS-DIPHTH-ACELL PERTUSSIS 5-2.5-18.5 LF-MCG/0.5 IM SUSY: 0.5000 mL | PREFILLED_SYRINGE | Freq: Once | INTRAMUSCULAR | Status: DC

## 2020-07-03 MED ORDER — OXYTOCIN-SODIUM CHLORIDE 30-0.9 UT/500ML-% IV SOLN
INTRAVENOUS | Status: AC
  Filled 2020-07-03: qty 500

## 2020-07-03 MED ORDER — PHENYLEPHRINE HCL-NACL 20-0.9 MG/250ML-% IV SOLN
INTRAVENOUS | Status: DC | PRN
  Administered 2020-07-03: 60 ug/min via INTRAVENOUS

## 2020-07-03 MED ORDER — KETOROLAC TROMETHAMINE 30 MG/ML IJ SOLN
INTRAMUSCULAR | Status: AC
  Filled 2020-07-03: qty 1

## 2020-07-03 MED ORDER — TERBUTALINE SULFATE 1 MG/ML IJ SOLN
INTRAMUSCULAR | Status: AC
  Administered 2020-07-03: 0.25 mg via SUBCUTANEOUS
  Administered 2020-07-03: 1 mg
  Filled 2020-07-03: qty 1

## 2020-07-03 MED ORDER — DEXTROSE 5 % IV SOLN
3.0000 g | INTRAVENOUS | Status: AC
  Administered 2020-07-03: 3 g via INTRAVENOUS
  Filled 2020-07-03 (×2): qty 3000

## 2020-07-03 MED ORDER — ACETAMINOPHEN 160 MG/5ML PO SOLN
1000.0000 mg | Freq: Four times a day (QID) | ORAL | Status: DC
  Administered 2020-07-03 – 2020-07-05 (×5): 1000 mg via ORAL
  Administered 2020-07-05: 1300 mg via ORAL
  Administered 2020-07-05: 1000 mg via ORAL
  Filled 2020-07-03 (×7): qty 40.6

## 2020-07-03 MED ORDER — OXYTOCIN-SODIUM CHLORIDE 30-0.9 UT/500ML-% IV SOLN: 2.5000 [IU]/h | INTRAVENOUS | Status: AC

## 2020-07-03 MED ORDER — OXYTOCIN-SODIUM CHLORIDE 30-0.9 UT/500ML-% IV SOLN
INTRAVENOUS | Status: DC | PRN
  Administered 2020-07-03: 250 mL/h via INTRAVENOUS

## 2020-07-03 MED ORDER — DEXAMETHASONE SODIUM PHOSPHATE 10 MG/ML IJ SOLN
INTRAMUSCULAR | Status: AC
  Filled 2020-07-03: qty 1

## 2020-07-03 MED ORDER — BUPIVACAINE IN DEXTROSE 0.75-8.25 % IT SOLN
INTRATHECAL | Status: DC | PRN
  Administered 2020-07-03: 1.6 mL via INTRATHECAL

## 2020-07-03 MED ORDER — NALBUPHINE HCL 10 MG/ML IJ SOLN: 5.0000 mg | INTRAMUSCULAR | Status: DC | PRN

## 2020-07-03 MED ORDER — OXYCODONE HCL 5 MG PO TABS
5.0000 mg | ORAL_TABLET | ORAL | Status: DC | PRN
  Administered 2020-07-04: 5 mg via ORAL
  Filled 2020-07-03: qty 1

## 2020-07-03 MED ORDER — SENNOSIDES-DOCUSATE SODIUM 8.6-50 MG PO TABS: 2.0000 | ORAL_TABLET | ORAL | Status: DC

## 2020-07-03 MED ORDER — MORPHINE SULFATE (PF) 0.5 MG/ML IJ SOLN
INTRAMUSCULAR | Status: DC | PRN
  Administered 2020-07-03: 150 ug via INTRATHECAL

## 2020-07-03 MED ORDER — NALBUPHINE HCL 10 MG/ML IJ SOLN: 5.0000 mg | Freq: Once | INTRAMUSCULAR | Status: DC | PRN

## 2020-07-03 MED ORDER — SODIUM CHLORIDE 0.9% FLUSH: 3.0000 mL | INTRAVENOUS | Status: DC | PRN

## 2020-07-03 MED ORDER — OXYCODONE HCL 5 MG/5ML PO SOLN: 5.0000 mg | Freq: Once | ORAL | Status: DC | PRN

## 2020-07-03 MED ORDER — ONDANSETRON HCL 4 MG/2ML IJ SOLN
INTRAMUSCULAR | Status: AC
  Filled 2020-07-03: qty 2

## 2020-07-03 MED ORDER — SIMETHICONE 80 MG PO CHEW
80.0000 mg | CHEWABLE_TABLET | Freq: Three times a day (TID) | ORAL | Status: DC
  Administered 2020-07-04 – 2020-07-05 (×3): 80 mg via ORAL
  Filled 2020-07-03 (×3): qty 1

## 2020-07-03 MED ORDER — SIMETHICONE 80 MG PO CHEW
80.0000 mg | CHEWABLE_TABLET | ORAL | Status: DC
  Administered 2020-07-03: 80 mg via ORAL
  Filled 2020-07-03 (×2): qty 1

## 2020-07-03 MED ORDER — SODIUM CHLORIDE 0.9 % IV SOLN
500.0000 mg | INTRAVENOUS | Status: AC
  Administered 2020-07-03: 500 mg via INTRAVENOUS

## 2020-07-03 MED ORDER — FENTANYL CITRATE (PF) 100 MCG/2ML IJ SOLN
INTRAMUSCULAR | Status: DC | PRN
  Administered 2020-07-03: 15 ug via INTRATHECAL

## 2020-07-03 MED ORDER — NALOXONE HCL 4 MG/10ML IJ SOLN
1.0000 ug/kg/h | INTRAVENOUS | Status: DC | PRN
  Filled 2020-07-03: qty 5

## 2020-07-03 MED ORDER — NALOXONE HCL 0.4 MG/ML IJ SOLN: 0.4000 mg | INTRAMUSCULAR | Status: DC | PRN

## 2020-07-03 MED ORDER — DIPHENHYDRAMINE HCL 25 MG PO CAPS: 25.0000 mg | ORAL_CAPSULE | ORAL | Status: DC | PRN

## 2020-07-03 MED ORDER — DIPHENHYDRAMINE HCL 25 MG PO CAPS: 25.0000 mg | ORAL_CAPSULE | Freq: Four times a day (QID) | ORAL | Status: DC | PRN

## 2020-07-03 MED ORDER — PHENYLEPHRINE HCL (PRESSORS) 10 MG/ML IV SOLN
INTRAVENOUS | Status: DC | PRN
  Administered 2020-07-03: 80 ug via INTRAVENOUS

## 2020-07-03 MED ORDER — FENTANYL CITRATE (PF) 100 MCG/2ML IJ SOLN
100.0000 ug | INTRAMUSCULAR | Status: DC | PRN
  Administered 2020-07-03 (×3): 100 ug via INTRAVENOUS
  Filled 2020-07-03 (×3): qty 2

## 2020-07-03 MED ORDER — PHENYLEPHRINE 40 MCG/ML (10ML) SYRINGE FOR IV PUSH (FOR BLOOD PRESSURE SUPPORT)
PREFILLED_SYRINGE | INTRAVENOUS | Status: AC
  Filled 2020-07-03: qty 10

## 2020-07-03 MED ORDER — ENOXAPARIN SODIUM 80 MG/0.8ML ~~LOC~~ SOLN
0.5000 mg/kg | SUBCUTANEOUS | Status: DC
  Administered 2020-07-04 – 2020-07-05 (×2): 62.5 mg via SUBCUTANEOUS
  Filled 2020-07-03 (×2): qty 0.8

## 2020-07-03 MED ORDER — LACTATED RINGERS AMNIOINFUSION: INTRAVENOUS | Status: DC

## 2020-07-03 MED ORDER — PRENATAL MULTIVITAMIN CH
1.0000 | ORAL_TABLET | Freq: Every day | ORAL | Status: DC
  Filled 2020-07-03: qty 1

## 2020-07-03 MED ORDER — DIPHENHYDRAMINE HCL 50 MG/ML IJ SOLN: 12.5000 mg | INTRAMUSCULAR | Status: DC | PRN

## 2020-07-03 MED ORDER — IBUPROFEN 100 MG/5ML PO SUSP
800.0000 mg | Freq: Three times a day (TID) | ORAL | Status: DC
  Administered 2020-07-03 – 2020-07-05 (×6): 800 mg via ORAL
  Filled 2020-07-03 (×6): qty 40

## 2020-07-03 MED ORDER — OXYCODONE HCL 5 MG PO TABS: 5.0000 mg | ORAL_TABLET | Freq: Once | ORAL | Status: DC | PRN

## 2020-07-03 MED ORDER — FENTANYL CITRATE (PF) 100 MCG/2ML IJ SOLN
INTRAMUSCULAR | Status: AC
  Administered 2020-07-03: 100 ug via INTRAVENOUS
  Filled 2020-07-03: qty 2

## 2020-07-03 MED ORDER — IBUPROFEN 800 MG PO TABS
800.0000 mg | ORAL_TABLET | Freq: Three times a day (TID) | ORAL | Status: DC
  Filled 2020-07-03: qty 1

## 2020-07-03 MED ORDER — SOD CITRATE-CITRIC ACID 500-334 MG/5ML PO SOLN
30.0000 mL | ORAL | Status: AC
  Administered 2020-07-03: 30 mL via ORAL

## 2020-07-03 MED ORDER — KETOROLAC TROMETHAMINE 30 MG/ML IJ SOLN
30.0000 mg | Freq: Once | INTRAMUSCULAR | Status: AC | PRN
  Administered 2020-07-03: 30 mg via INTRAVENOUS

## 2020-07-03 MED ORDER — NALBUPHINE HCL 10 MG/ML IJ SOLN
5.0000 mg | INTRAMUSCULAR | Status: DC | PRN
  Administered 2020-07-03: 5 mg via INTRAVENOUS
  Filled 2020-07-03: qty 1

## 2020-07-03 MED ORDER — FENTANYL CITRATE (PF) 100 MCG/2ML IJ SOLN
INTRAMUSCULAR | Status: AC
  Filled 2020-07-03: qty 2

## 2020-07-03 MED ORDER — COCONUT OIL OIL: 1.0000 "application " | TOPICAL_OIL | Status: DC | PRN

## 2020-07-03 MED ORDER — DEXAMETHASONE SODIUM PHOSPHATE 4 MG/ML IJ SOLN
INTRAMUSCULAR | Status: DC | PRN
  Administered 2020-07-03: 8 mg via INTRAVENOUS

## 2020-07-03 MED ORDER — MISOPROSTOL 25 MCG QUARTER TABLET: 25.0000 ug | ORAL_TABLET | Freq: Once | ORAL | Status: DC

## 2020-07-03 MED ORDER — PHENYLEPHRINE HCL-NACL 20-0.9 MG/250ML-% IV SOLN
INTRAVENOUS | Status: AC
  Filled 2020-07-03: qty 250

## 2020-07-03 MED ORDER — MENTHOL 3 MG MT LOZG: 1.0000 | LOZENGE | OROMUCOSAL | Status: DC | PRN

## 2020-07-03 MED ORDER — SIMETHICONE 80 MG PO CHEW
80.0000 mg | CHEWABLE_TABLET | ORAL | Status: DC | PRN
  Administered 2020-07-05: 80 mg via ORAL

## 2020-07-03 MED ORDER — PROMETHAZINE HCL 25 MG/ML IJ SOLN: 6.2500 mg | INTRAMUSCULAR | Status: DC | PRN

## 2020-07-03 MED ORDER — LACTATED RINGERS IV SOLN: INTRAVENOUS | Status: DC

## 2020-07-03 MED ORDER — MORPHINE SULFATE (PF) 0.5 MG/ML IJ SOLN
INTRAMUSCULAR | Status: AC
  Filled 2020-07-03: qty 10

## 2020-07-03 MED ORDER — ONDANSETRON HCL 4 MG/2ML IJ SOLN
INTRAMUSCULAR | Status: DC | PRN
  Administered 2020-07-03: 4 mg via INTRAVENOUS

## 2020-07-03 MED ORDER — WITCH HAZEL-GLYCERIN EX PADS: 1.0000 "application " | MEDICATED_PAD | CUTANEOUS | Status: DC | PRN

## 2020-07-03 MED ORDER — HYDROMORPHONE HCL 1 MG/ML IJ SOLN: 0.2500 mg | INTRAMUSCULAR | Status: DC | PRN

## 2020-07-03 SURGICAL SUPPLY — 35 items
APL SKNCLS STERI-STRIP NONHPOA (GAUZE/BANDAGES/DRESSINGS) ×1
BENZOIN TINCTURE PRP APPL 2/3 (GAUZE/BANDAGES/DRESSINGS) ×2 IMPLANT
CLOSURE WOUND 1/2 X4 (GAUZE/BANDAGES/DRESSINGS) ×1
CLOTH BEACON ORANGE TIMEOUT ST (SAFETY) ×3 IMPLANT
DRSG OPSITE POSTOP 4X10 (GAUZE/BANDAGES/DRESSINGS) ×3 IMPLANT
ELECT REM PT RETURN 9FT ADLT (ELECTROSURGICAL) ×3
ELECTRODE REM PT RTRN 9FT ADLT (ELECTROSURGICAL) ×1 IMPLANT
EXTRACTOR VACUUM KIWI (MISCELLANEOUS) IMPLANT
GAUZE SPONGE 4X4 12PLY STRL LF (GAUZE/BANDAGES/DRESSINGS) ×4 IMPLANT
GLOVE BIOGEL PI IND STRL 7.0 (GLOVE) ×1 IMPLANT
GLOVE BIOGEL PI INDICATOR 7.0 (GLOVE) ×2
GLOVE SURG ORTHO 8.0 STRL STRW (GLOVE) ×3 IMPLANT
GOWN STRL REUS W/TWL LRG LVL3 (GOWN DISPOSABLE) ×6 IMPLANT
KIT ABG SYR 3ML LUER SLIP (SYRINGE) IMPLANT
NDL HYPO 25X5/8 SAFETYGLIDE (NEEDLE) IMPLANT
NEEDLE HYPO 25X5/8 SAFETYGLIDE (NEEDLE) IMPLANT
NS IRRIG 1000ML POUR BTL (IV SOLUTION) ×3 IMPLANT
PACK C SECTION WH (CUSTOM PROCEDURE TRAY) ×3 IMPLANT
PAD ABD 7.5X8 STRL (GAUZE/BANDAGES/DRESSINGS) ×2 IMPLANT
PAD ABD 8X7 1/2 STERILE (GAUZE/BANDAGES/DRESSINGS) ×2 IMPLANT
PAD OB MATERNITY 4.3X12.25 (PERSONAL CARE ITEMS) ×3 IMPLANT
PENCIL SMOKE EVAC W/HOLSTER (ELECTROSURGICAL) ×3 IMPLANT
RTRCTR C-SECT PINK 25CM LRG (MISCELLANEOUS) IMPLANT
SPONGE GAUZE 4X4 12PLY STER LF (GAUZE/BANDAGES/DRESSINGS) ×4 IMPLANT
STRIP CLOSURE SKIN 1/2X4 (GAUZE/BANDAGES/DRESSINGS) ×1 IMPLANT
SUT MON AB-0 CT1 36 (SUTURE) ×6 IMPLANT
SUT PLAIN 0 NONE (SUTURE) IMPLANT
SUT VIC AB 0 CT1 27 (SUTURE) ×6
SUT VIC AB 0 CT1 27XBRD ANBCTR (SUTURE) ×2 IMPLANT
SUT VIC AB 2-0 CT1 27 (SUTURE) ×3
SUT VIC AB 2-0 CT1 TAPERPNT 27 (SUTURE) ×1 IMPLANT
SUT VIC AB 4-0 KS 27 (SUTURE) ×3 IMPLANT
TOWEL OR 17X24 6PK STRL BLUE (TOWEL DISPOSABLE) ×3 IMPLANT
TRAY FOLEY W/BAG SLVR 14FR LF (SET/KITS/TRAYS/PACK) ×3 IMPLANT
WATER STERILE IRR 1000ML POUR (IV SOLUTION) ×3 IMPLANT

## 2020-07-03 NOTE — Progress Notes (Addendum)
LABOR PROGRESS NOTE  Latoya West is a 19 y.o. G1P0000 at [redacted]w[redacted]d  admitted for IOL 2/2 PD.   Subjective: Endorsing discomfort with contractions.   Objective: BP 127/75   Pulse 85   Temp 98.1 F (36.7 C) (Oral)   Resp 18   Ht 5' 6.5" (1.689 m)   Wt 126.7 kg   LMP 09/19/2019   BMI 44.42 kg/m  or  Vitals:   07/02/20 1926 07/02/20 2035 07/02/20 2144 07/03/20 0000  BP: 123/87 127/81 105/78 127/75  Pulse: 100 90 83 85  Resp: 20 18  18   Temp:  98.2 F (36.8 C)  98.1 F (36.7 C)  TempSrc:  Oral  Oral  Weight:      Height:       Dilation: 4 Effacement (%): 60 Station: -2 Presentation: Vertex Exam by::  (Dr. [email or web address]) FHT: baseline rate 145 bpm, moderate varibility, 10 x 10 acel, prolonged decel x1 Toco: minimal  Labs: Lab Results  Component Value Date   WBC 7.7 07/02/2020   HGB 10.8 (L) 07/02/2020   HCT 33.9 (L) 07/02/2020   MCV 89.0 07/02/2020   PLT 265 07/02/2020    Patient Active Problem List   Diagnosis Date Noted  . Post term pregnancy at [redacted] weeks gestation 07/02/2020  . Alpha thalassemia silent carrier 04/04/2020  . Transient hypertension of pregnancy in first trimester 02/15/2020  . Obesity affecting pregnancy in first trimester 12/21/2019  . Encounter for supervision of normal first pregnancy in first trimester 12/09/2019  . Suicide attempt by drug ingestion (HCC) 07/08/2018  . 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 MDD (major depressive disorder) 07/07/2018  . Oppositional defiant disorder 11/05/2013   Assessment / Plan: 19 y.o. G1P0000 at [redacted]w[redacted]d here for IOL 2/2 PD.   Decreased fetal HRT Called to room due to decrease in fetal heart rate. Patient placed on hands and knees. Terbutaline given x1 and FSE placed. IVF bolus infusing. Cervical exam by Dr. [redacted]w[redacted]d revealed a labor progression of cervical change and rupture. FSE place. FHT subsequently had good recovery s/p interventions as noted above.  Subsequently placed on maternal left side given FHT stable.  Labor: Cyto x3, making cervical change SROM 0130 Fetal Wellbeing: Cat II strip; reassuringly good recovery s/p prolonged decel s/p interventions as noted above Pain Control:  Maternally supported; may have IV pain medication/ epidural if desired Anticipated MOD: Vaginal  Simone Autry-Lott, DO 07/03/2020, 2:20 AM PGY-2, [facility]

## 2020-07-03 NOTE — Op Note (Signed)
Arterial cord gas 7.27.  Sheila Oats, MD OB Fellow, [facility] 07/03/2020 11:07 AM

## 2020-07-03 NOTE — Discharge Summary (Signed)
Postpartum Discharge Summary  Date of Service updated 07/05/20     Patient Name: Latoya West DOB: 27-Mar-2001 MRN: [id number]  Date of admission: 07/02/2020 Delivery date:07/03/2020  Delivering provider: Griffin Basil  Date of discharge: 07/05/2020  Admitting diagnosis: Post term pregnancy at [redacted] weeks gestation [O48.0, Z3A.41] Intrauterine pregnancy: [redacted]w[redacted]d    Secondary diagnosis:  Principal Problem:   Cesarean delivery delivered Active Problems:   MDD (major depressive disorder)   Suicide attempt by drug ingestion ([facility]   MDD (major depressive disorder), recurrent severe, without psychosis ([facility]   Transient hypertension of pregnancy in first trimester   Alpha thalassemia silent carrier   Post term pregnancy at [redacted] weeks gestation  Additional problems: as noted above   Discharge diagnosis: Cesarean delivery 2/2 NRFHTs                                     Post partum procedures:none Augmentation: AROM, Pitocin and Cytotec Complications: None  Hospital course: Induction of Labor With Cesarean Section   19y.o. yo G1P0000 at 420w1das admitted to the hospital 07/02/2020 for induction of labor. Patient had a labor course significant for NRFHTs in latent labor. The patient went for cesarean section due to Non-Reassuring FHR. Delivery details are as follows: Membrane Rupture Time/Date: 1:25 AM ,07/03/2020   Delivery Method:C-Section, Low Transverse  Details of operation can be found in separate operative Note.  Patient had an uncomplicated postpartum course. She is ambulating, tolerating a regular diet, passing flatus, and urinating well.  Patient is discharged home in stable condition on 07/05/20.      Newborn Data: Birth date:07/03/2020  Birth time:9:51 AM  Gender:Female  Living status:Living  Apgars:9 ,9  Weight:3040 g                                 Magnesium Sulfate received: No BMZ received: No Rhophylac:N/A MMR:N/A T-DaP:Given prenatally Flu:  No Transfusion:No  Physical exam  Vitals:   07/04/20 0500 07/04/20 1445 07/04/20 2053 07/05/20 0540  BP:  119/62 114/70 111/66  Pulse:  83 77 83  Resp:  '20 18 16  ' Temp:  99.3 F (37.4 C) 98.5 F (36.9 C) 97.9 F (36.6 C)  TempSrc:  Oral Oral Oral  SpO2: 100% 100% 100% 100%  Weight:      Height:       General: alert, cooperative and no distress Lochia: appropriate Uterine Fundus: firm Incision: pressure dressing in place, c/d/i DVT Evaluation: No evidence of DVT seen on physical exam. Labs: Lab Results  Component Value Date   WBC 14.3 (H) 07/04/2020   HGB 7.8 (L) 07/04/2020   HCT 24.3 (L) 07/04/2020   MCV 89.7 07/04/2020   PLT 191 07/04/2020   CMP Latest Ref Rng & Units 12/21/2019  Glucose 65 - 99 mg/dL 73  BUN 6 - 20 mg/dL 7  Creatinine 0.57 - 1.00 mg/dL 0.58  Sodium 134 - 144 mmol/L 135  Potassium 3.5 - 5.2 mmol/L 3.8  Chloride 96 - 106 mmol/L 102  CO2 20 - 29 mmol/L 21  Calcium 8.7 - 10.2 mg/dL 9.0  Total Protein 6.0 - 8.5 g/dL 6.7  Total Bilirubin 0.0 - 1.2 mg/dL <0.2  Alkaline Phos 39 - 117 IU/L 44  AST 0 - 40 IU/L 12  ALT 0 - 32 IU/L 8  Edinburgh Score: No flowsheet data found.   After visit meds:  Allergies as of 07/05/2020      Reactions   Kiwi Extract Swelling, Rash      Medication List    STOP taking these medications   aspirin EC 81 MG tablet     TAKE these medications   acetaminophen 160 MG/5ML solution Commonly known as: TYLENOL Take 31.3 mLs (1,000 mg total) by mouth every 6 (six) hours.   coconut oil Oil Apply 1 application topically as needed.   Comfort Fit Maternity Supp Med Misc 1 Device by Does not apply route daily.   EpiPen 2-Pak 0.3 mg/0.3 mL Soaj injection Generic drug: EPINEPHrine Inject 0.3 mg into the muscle once as needed (for anaphylactic or severe allergic reaction).   ibuprofen 100 MG/5ML suspension Commonly known as: ADVIL Take 40 mLs (800 mg total) by mouth every 8 (eight) hours.   multivitamin-prenatal  27-0.8 MG Tabs tablet Take 1 tablet by mouth daily at 12 noon.   oxyCODONE 5 MG/5ML solution Commonly known as: ROXICODONE Take 5-10 mLs (5-10 mg total) by mouth every 4 (four) hours as needed for moderate pain.   polyethylene glycol 17 g packet Commonly known as: MiraLax Take 17 g by mouth daily.        Discharge home in stable condition Infant Feeding: Breast Infant Disposition:home with mother Discharge instruction: per After Visit Summary and Postpartum booklet. Activity: Advance as tolerated. Pelvic rest for 6 weeks.  Diet: routine diet Future Appointments: Future Appointments  Date Time Provider [place]  07/11/2020  1:40 PM [place] None  07/11/2020  2:00 PM Lynnea Ferrier, LCSW [facility] None   Follow up Visit: Message sent to [facility] on 07/03/20.  Please schedule this patient for a In person postpartum visit in 4 weeks with the following provider: Any provider. Additional Postpartum F/U:Postpartum Depression checkup and Incision check 1 week & BP check 1 week Low risk pregnancy complicated by: Depression, prior suicide attempt, IOL with Cesarean secondary to NRFHTs, transient HTN in pregnancy Delivery mode:  C-Section, Low Transverse  Anticipated Birth Control:  declines s/p counseling   55/10/7480 Arrie Senate, MD

## 2020-07-03 NOTE — Anesthesia Preprocedure Evaluation (Signed)
Anesthesia Evaluation  Patient identified by MRN, date of birth, ID band Patient awake    Reviewed: Allergy & Precautions, NPO status , Patient's Chart, lab work & pertinent test results  Airway Mallampati: II  TM Distance: >3 FB Neck ROM: Full    Dental no notable dental hx.    Pulmonary neg pulmonary ROS,    Pulmonary exam normal breath sounds clear to auscultation       Cardiovascular hypertension, Normal cardiovascular exam Rhythm:Regular Rate:Normal     Neuro/Psych Anxiety Depression negative neurological ROS  negative psych ROS   GI/Hepatic negative GI ROS, Neg liver ROS,   Endo/Other  negative endocrine ROS  Renal/GU negative Renal ROS  negative genitourinary   Musculoskeletal negative musculoskeletal ROS (+)   Abdominal (+) + obese,   Peds negative pediatric ROS (+)  Hematology negative hematology ROS (+)   Anesthesia Other Findings   Reproductive/Obstetrics (+) Pregnancy                             Anesthesia Physical Anesthesia Plan  ASA: III and emergent  Anesthesia Plan: Spinal   Post-op Pain Management:    Induction:   PONV Risk Score and Plan: 2 and Treatment may vary due to age or medical condition  Airway Management Planned: Natural Airway  Additional Equipment:   Intra-op Plan:   Post-operative Plan:   Informed Consent: I have reviewed the patients History and Physical, chart, labs and discussed the procedure including the risks, benefits and alternatives for the proposed anesthesia with the patient or authorized representative who has indicated his/her understanding and acceptance.     Dental advisory given  Plan Discussed with: CRNA  Anesthesia Plan Comments:         Anesthesia Quick Evaluation

## 2020-07-03 NOTE — Progress Notes (Signed)
Labor Progress Note ALTOVISE WAHLER is a 19 y.o. G1P0000 at [redacted]w[redacted]d presented for postdates IOL S: CTSP due to recurrent late deceleration.  She has only received cytotec for her induction.  The fetal tracing has had recurrent late decels.  The patient has received terbutaline and has been repositioned.  Pt had to be placed on all fours for the fetus to recover.    O:  BP (!) 120/47   Pulse 95   Temp 99.9 F (37.7 C) (Oral)   Resp 20   Ht 5' 6.5" (1.689 m)   Wt 126.7 kg   LMP 09/19/2019   BMI 44.42 kg/m  EFM: baseline 120's , moderate variability, recurrent late decels, currently recovered.  CVE: 4/80/-2  A&P: 19 y.o. G1P0000 [redacted]w[redacted]d nonreassuring fetal tracing Pt has had multiple interventions throughout the evening due to recurrent bradycardias. Cervix also remains unchanged at 4.5 cm . Caput appears to be developing as well.  Due to recurrence of decelerations and being remote from delivery, will proceed with primary low transverse cesarean section.  Risks and benefits given including bleeding, infection, involvement of other organs including bladder and bowel.  Consent signed and will proceed. Warden Fillers, MD 9:12 AM

## 2020-07-03 NOTE — Anesthesia Postprocedure Evaluation (Signed)
Anesthesia Post Note  Patient: Latoya West  Procedure(s) Performed: CESAREAN SECTION     Patient location during evaluation: PACU Anesthesia Type: Spinal Level of consciousness: awake and alert Pain management: pain level controlled Vital Signs Assessment: post-procedure vital signs reviewed and stable Respiratory status: spontaneous breathing, nonlabored ventilation and respiratory function stable Cardiovascular status: blood pressure returned to baseline and stable Postop Assessment: no apparent nausea or vomiting Anesthetic complications: no   No complications documented.  Last Vitals:  Vitals:   07/03/20 1200 07/03/20 1223  BP: 120/79 99/82  Pulse: 79 84  Resp: 20 18  Temp:  37.2 C  SpO2: 96% 99%    Last Pain:  Vitals:   07/03/20 1230  TempSrc:   PainSc: 0-No pain   Pain Goal:                   Lowella Curb

## 2020-07-03 NOTE — Progress Notes (Signed)
LABOR PROGRESS NOTE  YANCY HASCALL is a 19 y.o. G1P0000 at [redacted]w[redacted]d  admitted for IOL 2/2 PD   Subjective: Endorsing abdominal discomfort with contractions.   Objective: BP 127/75   Pulse 85   Temp 98.1 F (36.7 C) (Oral)   Resp 18   Ht 5' 6.5" (1.689 m)   Wt 126.7 kg   LMP 09/19/2019   BMI 44.42 kg/m  or  Vitals:   07/02/20 1926 07/02/20 2035 07/02/20 2144 07/03/20 0000  BP: 123/87 127/81 105/78 127/75  Pulse: 100 90 83 85  Resp: 20 18  18   Temp:  98.2 F (36.8 C)  98.1 F (36.7 C)  TempSrc:  Oral  Oral  Weight:      Height:       Dilation: 4 Effacement (%): 80 Station: -2 Presentation: Vertex Exam by:: Dr.Autry-Lott FHT: baseline rate 130 bpm, moderate varibility, 15 x 15 acel, no decels Toco: 1-4 mins s/p terbutaline x2  Labs: Lab Results  Component Value Date   WBC 7.7 07/02/2020   HGB 10.8 (L) 07/02/2020   HCT 33.9 (L) 07/02/2020   MCV 89.0 07/02/2020   PLT 265 07/02/2020   Patient Active Problem List   Diagnosis Date Noted  . Post term pregnancy at [redacted] weeks gestation 07/02/2020  . Alpha thalassemia silent carrier 04/04/2020  . Transient hypertension of pregnancy in first trimester 02/15/2020  . Obesity affecting pregnancy in first trimester 12/21/2019  . Encounter for supervision of normal first pregnancy in first trimester 12/09/2019  . Suicide attempt by drug ingestion (HCC) 07/08/2018  . 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 MDD (major depressive disorder) 07/07/2018  . Oppositional defiant disorder 11/05/2013   Assessment / Plan: 19 y.o. G1P0000 at [redacted]w[redacted]d here for IOL PD.   Recurrent episode of fetal bradycardia to 70s with contractions with good recovery. Terbutaline  x1 given. Position changes. Amnio infusion bolus 300cc w/ maintaneance of 150 cc/hr  Labor: As above. Will hold off on pitocin for the reasons above. S/p terbutaline x2.  Fetal Wellbeing:  Cat I Pain Control:  Maternally  supported, may have epidural when desired Anticipated MOD:  Vaginal  Oddis Westling Autry-Lott, DO 07/03/2020, 7:29 AM PGY-2, [facility]

## 2020-07-03 NOTE — Lactation Note (Signed)
This note was copied from a baby's chart. Lactation Consultation Note  Patient Name: Girl Yenesis Even [id number]'[id number] Date: 07/03/2020 Reason for consult: Initial assessment;Mother's request;Primapara;1st time breastfeeding;Term;Other (Comment) (Transient Hypertension/ Obesity)  Infant is 41 weeks 11 hours old. Mom had a Doula during pregnancy and did some reading about breastfeeding. She did hand expression prior to delivery and noted drops of colostrum.   Mom feeding based on cues 15-25 minutes. LC reviewed feeding cues with Mom. We went over tracking feedings on the I's and O's sheet. Infant had 1 urine and 2 stool since birth. Mom denies any pain with latching. Infant latched in cross cradle on the left breast with signs of milk transfer. LC reviewed with Mom to not use a pacifier for 4 weeks until a good latch is established at the breast. At the time of the visit, infant was swaddled. LC reviewed with parents importance of putting infant STS.   Mom has WIC and does not have a pump at home. LC set up a manual pump and reviewed parts, assembly, cleaning and storage.   Plan 1. To feed based on cues 8-12x in 24 hour period no more than 3 hours without attempt. Mom to offer both breasts during a feed looking of signs of milk transfer.          2. Manual pump set up Mom can pre pump to get her nipples out before latching. Mom can supplement EBM with spoon feeding based on breastfeeding supplementation guidelines.         3. I's and O's sheet reviewed        4. [facility] brochure on inpatient and outpatient services reviewed.

## 2020-07-03 NOTE — Discharge Instructions (Signed)

## 2020-07-03 NOTE — Op Note (Signed)
Latoya West PROCEDURE DATE: 07/03/2020  PREOPERATIVE DIAGNOSES: Intrauterine pregnancy at [redacted]w[redacted]d weeks gestation; non-reassuring fetal heart tracing in latent labor  POSTOPERATIVE DIAGNOSES: The same, Asynclitic fetal presentation  PROCEDURE: Primary Low Transverse Cesarean Section  SURGEON:  Dr. Mariel Aloe, MD   Dr. Lynnda Shields, MD  ANESTHESIOLOGY TEAM: Anesthesiologist: Lowella Curb, MD CRNA: Franco Nones, CRNA  INDICATIONS: Latoya West is a 19 y.o. G1P0000 at [redacted]w[redacted]d here for cesarean section secondary to the indications listed under preoperative diagnoses; please see preoperative note for further details.  The risks of surgery were discussed with the patient including but were not limited to: bleeding which may require transfusion or reoperation; infection which may require antibiotics; injury to bowel, bladder, ureters or other surrounding organs; injury to the fetus; need for additional procedures including hysterectomy in the event of a life-threatening hemorrhage; formation of adhesions; placental abnormalities wth subsequent pregnancies; incisional problems; thromboembolic phenomenon and other postoperative/anesthesia complications.  The patient concurred with the proposed plan, giving informed written consent for the procedure.    FINDINGS:  Viable female infant in cephalic presentation.  Apgars 9 & 9 and weight per medical record.  Clear amniotic fluid. Intact placenta, three vessel cord.  Normal uterus, fallopian tubes and ovaries bilaterally.  ANESTHESIA: Spinal INTRAVENOUS FLUIDS: 1000 ml   ESTIMATED BLOOD LOSS: 579 ml URINE OUTPUT:  100 ml SPECIMENS: Placenta sent to L&D COMPLICATIONS: None immediate  PROCEDURE IN DETAIL:  The patient preoperatively received intravenous antibiotics (ancef 2g, azithromycin 500mg ) and had sequential compression devices applied to her lower extremities.  She was then taken to the operating room where spinal anesthesia was  administered and was found to be adequate. She was then placed in a dorsal supine position with a leftward tilt, and prepped and draped in a sterile manner.  A foley catheter was placed into her bladder and attached to constant gravity.  After an adequate timeout was performed, a Pfannenstiel skin incision was made with scalpel and carried through to the underlying layer of fascia. The fascia was incised in the midline, and this incision was extended bilaterally using the Mayo scissors.  Kocher clamps were applied to the superior aspect of the fascial incision and the underlying rectus muscles were dissected off bluntly and sharply.  A similar process was carried out on the inferior aspect of the fascial incision. The rectus muscles were separated in the midline and the peritoneum was entered bluntly. The Alexis self-retaining retractor was introduced into the abdominal cavity.  Attention was turned to the lower uterine segment where a bladder flap was created sharply and bluntly. A low transverse hysterotomy was then made with a scalpel and extended bilaterally bluntly.  The infant was successfully delivered, the cord was clamped and cut after one minute, and the infant was handed over to the awaiting neonatology team. Cord gas was collected but found to be an inadequate sample. Uterine massage was then administered, and the placenta delivered intact with a three-vessel cord. The uterus was then cleared of clots and debris.  The hysterotomy was closed with 0 Monocryl in a running locked fashion, and an imbricating layer was also placed with 0 Vicryl. Several Figure-of-eight 0 Monocryl serosal stitches were placed to help with hemostasis.  The pelvis was irrigated and cleared of all clot and debris. Hemostasis was confirmed on all surfaces.  The retractor was removed.  The peritoneum was closed with a 0 Vicryl running stitch. The fascia was then closed using 0 Vicryl in a  running fashion.  The subcutaneous layer was  irrigated, and reapproximated with 2-0 vicryl interrupted stitches, and the skin was closed with a 4-0 Vicryl subcuticular stitch. The patient tolerated the procedure well. Sponge, instrument and needle counts were correct x 3.  She was taken to the recovery room in stable condition.   Sheila Oats, MD OB Fellow, [facility] 07/03/2020 10:50 AM

## 2020-07-03 NOTE — Transfer of Care (Signed)
Immediate Anesthesia Transfer of Care Note  Patient: Latoya West  Procedure(s) Performed: CESAREAN SECTION  Patient Location: PACU  Anesthesia Type:Spinal  Level of Consciousness: awake, alert  and patient cooperative  Airway & Oxygen Therapy: Patient Spontanous Breathing  Post-op Assessment: Report given to RN and Post -op Vital signs reviewed and stable  Post vital signs: Reviewed and stable  Last Vitals:  Vitals Value Taken Time  BP    Temp    Pulse 95 07/03/20 1054  Resp 27 07/03/20 1054  SpO2 100 % 07/03/20 1054  Vitals shown include unvalidated device data.  Last Pain:  Vitals:   07/03/20 0754  TempSrc: Oral  PainSc:          Complications: No complications documented.

## 2020-07-03 NOTE — Anesthesia Procedure Notes (Signed)
Spinal  Patient location during procedure: OB End time: 07/03/2020 9:30 AM Staffing Performed: anesthesiologist  Anesthesiologist: Lowella Curb, MD Preanesthetic Checklist Completed: patient identified, IV checked, risks and benefits discussed, surgical consent, monitors and equipment checked, pre-op evaluation and timeout performed Spinal Block Patient position: sitting Prep: DuraPrep and site prepped and draped Patient monitoring: heart rate, cardiac monitor, continuous pulse ox and blood pressure Approach: midline Location: L3-4 Injection technique: single-shot Needle Needle type: Pencan  Needle gauge: 24 G Needle length: 10 cm Assessment Sensory level: T4

## 2020-07-04 LAB — CBC
HCT: 24.3 % — ABNORMAL LOW (ref 36.0–46.0)
Hemoglobin: 7.8 g/dL — ABNORMAL LOW (ref 12.0–15.0)
MCH: 28.8 pg (ref 26.0–34.0)
MCHC: 32.1 g/dL (ref 30.0–36.0)
MCV: 89.7 fL (ref 80.0–100.0)
Platelets: 191 10*3/uL (ref 150–400)
RBC: 2.71 MIL/uL — ABNORMAL LOW (ref 3.87–5.11)
RDW: 14.2 % (ref 11.5–15.5)
WBC: 14.3 10*3/uL — ABNORMAL HIGH (ref 4.0–10.5)
nRBC: 0 % (ref 0.0–0.2)

## 2020-07-04 MED ORDER — SODIUM CHLORIDE 0.9 % IV SOLN
500.0000 mg | Freq: Once | INTRAVENOUS | Status: AC
  Administered 2020-07-04: 500 mg via INTRAVENOUS
  Filled 2020-07-04: qty 25

## 2020-07-04 NOTE — Clinical Social Work Maternal (Signed)
CLINICAL SOCIAL WORK MATERNAL/CHILD NOTE  Patient Details  Name: Latoya West MRN: [id number] Date of Birth: 09/27/2000  Date:  07/04/2020  Clinical Social Worker Initiating Note:  Darra Lis, [place] Date/Time: Initiated:  07/04/20/0830     Child's Name:  Latoya West   Biological Parents:  Mother, Father   Need for Interpreter:  None   Reason for Referral:  Behavioral Health Concerns   Address:  [place] [address]    Phone number:  [phone] (home)     Additional phone number:   Household Members/Support Persons (HM/SP):   Household Member/Support Person 1   HM/SP Name Relationship DOB or Age  HM/SP -1 Ladoris Gene Mother 11/05/1967  HM/SP -2        HM/SP -3        HM/SP -4        HM/SP -5        HM/SP -6        HM/SP -7        HM/SP -8          Natural Supports (not living in the home):      Professional Supports: Transport planner, Organized support group (Comment) ([facility])   Employment: Part-time, Ship broker   Type of Work: Visual merchandiser   Education:  Nurse, adult   Homebound arranged:    Museum/gallery curator Resources:  Medicaid   Other Resources:  [facility]   Cultural/Religious Considerations Which May Impact Care:    Strengths:  Ability to meet basic needs , Lexicographer chosen, Home prepared for child , Understanding of illness   Psychotropic Medications:         Pediatrician:    [place] area  Pediatrician List:   [facility] ([address])  [place]      Pediatrician Fax Number:    Risk Factors/Current Problems:  Mental Health Concerns    Cognitive State:  Alert , Goal Oriented , Insightful    Mood/Affect:  Happy , Interested , Calm    CSW Assessment: CSW consulted for a history of THC use, depression/anxiety, ODD & a 2019 suicide attempt. CSW met wit MOB to  offer support and complete assessment. CSW entered room and observed FOB on couch holding newborn and MOB in bed. CSW introduced self and role. CSW asked MOB if she would like to speak alone for privacy, MOB declined. MOB resides with her mother and works part-time with Visual merchandiser. MOB stated she is also in school to become an Engineering geologist. MOB receives [facility] and was told she is not able to get food stamps while residing with her mother due to her age. MOB stated her mother is not eligible for food stamps.    MOB confirmed a mental health diagnosis of depression and anxiety. MOB stated she was first diagnosed in 2016. MOB stated she was given different medications over time, due to being misdiagnosed so no medications were ever helpful. MOB stated she went to therapy in 2018 and found it to be somewhat helpful. MOB stated she currently speaks with a therapist at her OBGYN office. CSW asked MOB about the events leading to her 2019 suicide attempt. MOB stated she went through a lot of life events occurring back to back. MOB stated that at the time she felt suicide was her only way out. CSW asked MOB if the  events were isolated or things she is currently experiencing. MOB stated most of the events have been resolved and that she has learned to cope with the other events. MOB stated she has learned and understands things are always going to be perfect. MOB expressed she is more at peace now and has more control over her life and things that happen. MOB stated she has not had any SI since that time in 2019. MOB denies any current SI or HI. MOB identified her mother as a support. MOB stated she is also involved with [facility] and has been with them since 27 weeks. MOB stated she currently feels excited about newborn. CSW asked MOB about a history of THC use. MOB stated she did not use during her pregnancy. MOB stated her last THC use was prior to knowing she was pregnant.   CSW provided education regarding  the baby blues period vs. perinatal mood disorders, discussed treatment and gave resources for mental health follow up if concerns arise.  CSW recommends self-evaluation during the postpartum time period using the New Mom Checklist from Postpartum Progress and encouraged MOB to contact a medical professional if symptoms are noted at any time.    CSW provided review of Sudden Infant Death Syndrome (SIDS) precautions. MOB stated baby will sleep in a bassinet once discharged home. MOB stated she has all of the essential needs for baby to discharge home, including a new carseat. MOB plans for newborn to have follow-up care at [facility]. MOB denies any transportation barriers to follow-up care. MOB declined any additional community agency referrals.   CSW identifies no further need for intervention and no barriers to discharge at this time.   CSW Plan/Description:  No Further Intervention Required/No Barriers to Discharge, Sudden Infant Death Syndrome (SIDS) Education, Perinatal Mood and Anxiety Disorder (PMADs) Education, Other Information/Referral to [employer], [place] 07/04/2020, 10:28 AM

## 2020-07-04 NOTE — Progress Notes (Addendum)
POSTPARTUM PROGRESS NOTE  Post Partum Day 1  Subjective:  BLIMI GODBY is a 19 y.o. G1P1001 s/p pLTCS at [redacted]w[redacted]d.  No acute events overnight.  Pt denies problems with ambulating, voiding or po intake.  She denies nausea or vomiting.  Pain is well controlled.  She has had flatus. She has not had bowel movement.  Lochia Small. She has a PMH of depression, but denied any current symptoms. She is able to ambulate and does not feel short of breath.  Objective: Blood pressure (!) 122/59, pulse 85, temperature 99.1 F (37.3 C), temperature source Oral, resp. rate 18, height 5' 6.5" (1.689 m), weight 126.7 kg, last menstrual period 09/19/2019, SpO2 100 %, unknown if currently breastfeeding.  Physical Exam:  General: alert, cooperative and no distress Chest: no respiratory distress Heart:regular rate, distal pulses intact Abdomen: soft, nontender,  Uterine Fundus: firm, appropriately tender DVT Evaluation: No calf swelling or tenderness Extremities: trace edema Skin: warm, dry; dressing clean/dry/intact  Recent Labs    07/02/20 1400 07/04/20 0547  HGB 10.8* 7.8*  HCT 33.9* 24.3*    Assessment/Plan: TAMULA MORRICAL is a 19 y.o. G1P1001 s/p pLTCS at 104w1d   Anemia- Hgb 7.8- Administer Feraheme, estimated blood loss 587 ml PPD#1 - Doing well, she reports some soreness with ambulation. Contraception: declined Feeding: breast Dispo: Plan for discharge tomorrow   LOS: 2 days   Rocky Crafts 07/04/2020, 7:59 AM   Midwife attestation I have seen and examined this patient and agree with above documentation in the student's note.   Post Partum Day 1  MARGARIE MCGUIRT is a 19 y.o. G1P1001 s/p CS.  Pt denies problems with ambulating, voiding or po intake. Pain is well controlled. Method of Feeding: breast  PE:  Gen: well appearing Heart: reg rate Lungs: normal WOB Fundus firm Ext: soft, no pain, no edema  Assessment: S/p CS PPD #1 Acute blood loss anemia;  asymptomatic Hx of MDD, ODD, and suicide attempt  Plan for discharge: tomorrow [place] today Ambulate Seen by MSW, no barriers for discharge Mood check in 1-2 weeks- message sent  Donette Larry, CNM 1:36 PM

## 2020-07-04 NOTE — Lactation Note (Signed)
This note was copied from a baby's chart. Lactation Consultation Note  Patient Name: Latoya West [id number] Date: 07/04/2020  P1, 37 hour term female infant -4% weight loss. Per mom, she feels BF is going well most feedings are 30 to 40 minutes in length. LC did not observe latch at this time, parents do not have any questions concerning BF at this time. Infant  is currently cluster feeding, mom on occasion has been hand expressing after latching infant at breast to give extra volume of EBM. Mom will continue to BF infant according to hunger cues, 8 to 12+ times within 24 hours, STS. MGM is mom's support person and is encouraging her daughter to BF, per MGM, she BF all of her children and will support her daughter with BF infant. Mom knows to call RN or LC if she has any questions, concerns or needs assistance with latching infant at the breast.    Maternal Data    Feeding    LATCH Score                   Interventions    Lactation Tools Discussed/Used     Consult Status      Danelle Earthly 07/04/2020, 10:59 PM

## 2020-07-05 MED ORDER — COCONUT OIL OIL
1.0000 | TOPICAL_OIL | 0 refills | Status: DC | PRN
Start: 2020-07-05 — End: 2020-08-04

## 2020-07-05 MED ORDER — POLYETHYLENE GLYCOL 3350 17 G PO PACK: 17.0000 g | PACK | Freq: Every day | ORAL | 0 refills | Status: DC

## 2020-07-05 MED ORDER — ACETAMINOPHEN 160 MG/5ML PO SOLN
1000.0000 mg | Freq: Four times a day (QID) | ORAL | 0 refills | Status: DC
Start: 2020-07-05 — End: 2020-08-04

## 2020-07-05 MED ORDER — IBUPROFEN 100 MG/5ML PO SUSP
800.0000 mg | Freq: Three times a day (TID) | ORAL | Status: DC
Start: 2020-07-05 — End: 2020-08-04

## 2020-07-05 MED ORDER — OXYCODONE HCL 5 MG/5ML PO SOLN
5.0000 mg | ORAL | 0 refills | Status: DC | PRN
Start: 2020-07-05 — End: 2020-08-04

## 2020-07-05 MED ORDER — OXYCODONE HCL 5 MG/5ML PO SOLN
5.0000 mg | ORAL | Status: DC | PRN
  Administered 2020-07-05: 5 mg via ORAL
  Filled 2020-07-05: qty 5

## 2020-07-05 NOTE — Lactation Note (Signed)
This note was copied from a baby's chart. Lactation Consultation Note  Patient Name: Latoya West [id number] Date: 07/05/2020  Upon [facility] arrival mom attempting to breastfeed.  However mom on her cell phone and baby rooting  everywhere and coming off and on the nipple. Mom reported maybe she was done.  Infant still very tight/tense/fists clenched and rooting. Asked mom if we could do some hand expression on left breast while infant breastfeeding on the right. Mom agreed.  Able to hand express drops easily and encouraged mom to feed them back to infant when she was done.  Urged her to add some hand expression to breastfeeding and feed back the drops to infant via spoon.  Dad came in. Discussed with parents how to know when baby is done.  Explained baby will be content, let go, fall asleep, and body language show she is content.  Discussed unclenched hands and relaxed body.  Showed dad how to massage and compress moms breasts during the feeding to help her and baby Latoya get more milk.  Discussed recommneded voids and stools in the next 24 hours.  Parents have pediatrician appt for infant tomorrow.  Infant started to get more relaxed and dad noticed.  RN came in to discharge them.  Left mom and baby breastfeeding.  Urged to contact lactation as needed.  Parents have Breastfeeding Consultation Services handout for home use.   Maternal Data    Feeding Feeding Type: Breast Fed  [facility] Score                   Interventions    Lactation Tools Discussed/Used     Consult Status      Mylia Pondexter Michaelle Copas 07/05/2020, 2:04 PM

## 2020-07-11 ENCOUNTER — Ambulatory Visit (INDEPENDENT_AMBULATORY_CARE_PROVIDER_SITE_OTHER): Payer: Medicaid Other | Admitting: Licensed Clinical Social Worker

## 2020-07-11 ENCOUNTER — Ambulatory Visit: Payer: Medicaid Other

## 2020-07-11 ENCOUNTER — Other Ambulatory Visit: Payer: Self-pay

## 2020-07-11 VITALS — BP 104/70 | HR 80 | Temp 98.0°F | Wt 258.0 lb

## 2020-07-11 DIAGNOSIS — Z5189 Encounter for other specified aftercare: Secondary | ICD-10-CM

## 2020-07-11 DIAGNOSIS — Z8659 Personal history of other mental and behavioral disorders: Secondary | ICD-10-CM | POA: Diagnosis not present

## 2020-07-11 NOTE — BH Specialist Note (Signed)
Integrated Behavioral Health Follow Up Visit  MRN: [id number] Name: Latoya West  Number of Integrated Behavioral Health Clinician visits: 2 Session Start time: 2:10pm  Session End time: 2:36pm Total time: 26 mins in person at [facility]   Type of Service: Integrated Behavioral Health- Individual Interpretor:no  Interpretor Name and Language: none   SUBJECTIVE: Latoya West is a 19 y.o. female accompanied by n/a Patient was referred by hospital discharge for mood check. Patient reports the following symptoms/concerns: trouble breast feeding  Duration of problem: since discharge; Severity of problem: mild   OBJECTIVE: Mood: good  and Affect: normal  Risk of harm to self or others: no risk of harm to self or others.   LIFE CONTEXT: Family and Social: Lives with family in [place] [employer]/Work: [employer]  Self-Care: n/a Life Changes: Recent Delivery   GOALS ADDRESSED: Patient will: 1.  Reduce symptoms of: n/a   2.  Increase knowledge of postpartum depression and learn intervention to recognize symptoms  3.  Demonstrate ability to: self manage symptoms   INTERVENTIONS: Interventions utilized:  pyschoeducation  Standardized Assessments completed: edinburgh completed 07/11/2020 score 3  ASSESSMENT: Patient completed two week mood check     PLAN: 1. Follow up with behavioral health clinician on : as needed  2. Behavioral recommendations: Connect with lactation consultant for breast feeding assistance  3. Referral(s): lactation consultant S. Hice RN  4. "From scale of 1-10, how likely are you to follow plan?":   Gwyndolyn Saxon, LCSW

## 2020-07-11 NOTE — Progress Notes (Signed)
Subjective:     Latoya West is a 19 y.o. female who presents to the clinic 1 weeks status post C-Section  . Eating a regular diet without difficulty. Bowel movements are normal. Pain 1/10x also controlled with prescriped medication.    Review of Systems Review of systems not obtained due to patient factors.    Objective:    LMP 09/19/2019  General:  alert  Abdomen: ,   Incision:   healing well, no drainage, no erythema, no hernia, no seroma, no swelling, incision well approximated honeycomb dressing removed today.      Assessment:    Doing well postoperatively.   Plan:    1. Continue any current medications. 2. Wound care discussed. 3. Activity restrictions: Currently Post Partum delivered 07/03/20 4. Anticipated return to work: Discuss after post partum with provider. . 5. Follow up: 4-6wks after delivery for PP with provider pt made aware to schedule appt.   Kennon Portela, CMA

## 2020-07-14 NOTE — Progress Notes (Signed)
Patient was assessed and managed by nursing staff during this encounter. I have reviewed the chart and agree with the documentation and plan. I have also made any necessary editorial changes.  Catalina Antigua, MD 07/14/2020 1:25 PM

## 2020-07-29 ENCOUNTER — Encounter (HOSPITAL_COMMUNITY): Payer: Self-pay

## 2020-07-29 ENCOUNTER — Other Ambulatory Visit: Payer: Self-pay

## 2020-07-29 ENCOUNTER — Emergency Department (HOSPITAL_COMMUNITY)
Admission: EM | Admit: 2020-07-29 | Discharge: 2020-07-30 | Disposition: A | Discharge status: Home / Self Care | Payer: Medicaid Other | Attending: Emergency Medicine | Admitting: Emergency Medicine

## 2020-07-29 DIAGNOSIS — R070 Pain in throat: Secondary | ICD-10-CM | POA: Diagnosis not present

## 2020-07-29 DIAGNOSIS — Z5321 Procedure and treatment not carried out due to patient leaving prior to being seen by health care provider: Secondary | ICD-10-CM | POA: Insufficient documentation

## 2020-07-29 LAB — GROUP A STREP BY PCR: Group A Strep by PCR: NOT DETECTED

## 2020-07-29 NOTE — ED Triage Notes (Addendum)
Pt reports sore throat 3/10 for 3 days. Pt reports back of throat has gotten more white over the past 3 days. Pt reports it is hard to swallow and has been getting nauseous when trying to eat or drink.  Pt is currently breastfeeding.

## 2020-07-30 NOTE — ED Notes (Signed)
Pt called for reassessment x3 with no response 

## 2020-08-01 ENCOUNTER — Ambulatory Visit: Payer: Medicaid Other | Admitting: Advanced Practice Midwife

## 2020-08-01 ENCOUNTER — Encounter: Payer: Self-pay | Admitting: Obstetrics

## 2020-08-04 ENCOUNTER — Encounter: Payer: Self-pay | Admitting: Nurse Practitioner

## 2020-08-04 ENCOUNTER — Encounter: Payer: Self-pay | Admitting: Obstetrics

## 2020-08-04 ENCOUNTER — Ambulatory Visit (INDEPENDENT_AMBULATORY_CARE_PROVIDER_SITE_OTHER): Payer: Medicaid Other | Admitting: Nurse Practitioner

## 2020-08-04 ENCOUNTER — Other Ambulatory Visit: Payer: Self-pay

## 2020-08-04 DIAGNOSIS — N6320 Unspecified lump in the left breast, unspecified quadrant: Secondary | ICD-10-CM

## 2020-08-04 DIAGNOSIS — Z6841 Body Mass Index (BMI) 40.0 and over, adult: Secondary | ICD-10-CM

## 2020-08-04 DIAGNOSIS — O99893 Other specified diseases and conditions complicating puerperium: Secondary | ICD-10-CM

## 2020-08-04 NOTE — Progress Notes (Signed)
amb re   Post Partum Visit Note  Latoya West is a 19 y.o. G32P1001 female who presents for a postpartum visit. She is 4 weeks postpartum following a primary cesarean section.  I have fully reviewed the prenatal and intrapartum course. The delivery was at 41w1 gestational weeks.  Anesthesia: spinal. Postpartum course has been unremarkable. Baby is doing well. Baby is feeding by both breast and bottle - Daron Offer.. Bleeding spotting. Bowel function is normal. Bladder function is normal. Patient is not sexually active. Contraception method is none. Does not desire birth control at this time. Postpartum depression screening: negative. PHQ9 = 0  She had a sore throat last week and went to ER.  Could not wait to be seen and throat is better now.  The pregnancy intention screening data noted above was reviewed. Potential methods of contraception were discussed. The patient elected to proceed with Abstinence.      The following portions of the patient's history were reviewed and updated as appropriate: allergies, current medications, past family history, past medical history, past social history, past surgical history and problem list.  Review of Systems Pertinent items noted in HPI and remainder of comprehensive ROS otherwise negative.    Objective:  LMP 09/19/2019    General:  alert, cooperative, icteric and no distress   Breasts:  Continues to have small 3-4 mm mass at 8 o'clock on Left breast.  slightly tender  Lungs: clear to auscultation bilaterally  Heart:  regular rate and rhythm, S1, S2 normal, no murmur, click, rub or gallop  Abdomen: soft, non-tender; bowel sounds normal; no masses,  no organomegaly   Vulva:  pelvic deferred  Vagina:   Cervix:    Corpus:   Adnexa:    Rectal Exam:         Assessment:    Normal postpartum exam. Pap smear not done at today's visit.  Breast mass - ultrasound ordered  Plan:   Essential components of care per ACOG  recommendations:  1.  Mood and well being: Patient with negative depression screening today. Reviewed local resources for support.  - Patient does not use tobacco.  - hx of drug use? No    2. Infant care and feeding:  -Patient currently breastmilk feeding? Yes  -Social determinants of health (SDOH) reviewed in EPIC. No concerns  3. Sexuality, contraception and birth spacing - Patient does not want a pregnancy in the next year.  - Reviewed forms of contraception in tiered fashion. Patient desired abstinence today.   - Discussed birth spacing of 18 months  4. Sleep and fatigue -Encouraged family/partner/community support of 4 hrs of uninterrupted sleep to help with mood and fatigue  5. Physical Recovery  - Discussed patients delivery and complications - Patient has urinary incontinence? No - Patient is safe to resume physical and sexual activity, but does not plan to have sexual activity  6.  Health Maintenance - Last pap smear - not indicated  7. Breast mass left side Breast ultrasound ordered  Dalphine Handing, CMA [facility] [employer], [facility]   Nolene Bernheim, RN, MSN, NP-BC Nurse Practitioner, [facility] for [employer], [facility] 08/04/2020 11:27 AM

## 2020-08-04 NOTE — Patient Instructions (Signed)
Check out Covid vaccine info here:   ttps://covid19.ncdhhs.gov/vaccines  7 Things You Should Know About the COVID-19 Vaccines  Here are seven facts you should know before taking your shot:  1.  No serious side effects were reported in clinical trials. Temporary reactions after receiving the vaccine may include a sore arm, headache, feeling tired and achy for a day or two or, in some cases, fever. In most cases, these reactions are good signs that your body is building protection.   2.  Scientists had a head start. They are built on decades of research on vaccines for similar viruses. A big investment of resources and focus made sure they were created without skipping any steps in development, testing, or clinical trials.  3. You cannot get COVID-19 from the vaccine. The vaccine gives your body instructions to make a protein that safely teaches you to make germ-fighting antibodies to fight the real COVID-19.  4.The vaccine protects against the Delta variant. The Delta variant, which is now predominant in [place], is much more contagious than the original virus. Vaccines continue to be remarkably effective in reducing risk of severe disease, hospitalization, and death, even against the Delta variant.  5. A hundred million people in the [place] have already received their COVID-19 vaccine.  6. It works. And once you're fully vaccinated you're protected. The vaccines are proven to help prevent COVID-19 and are effective in preventing hospitalization and death.   7. The vaccine does not affect fertility. Vaccination for those who are pregnant or wanting to become pregnant is recommended by the American College of Obstetricians and Gynecologists (ACOG), the Society for Maternal-Fetal Medicine (SMFM), the American Society for Reproductive Medicine (ASRM), and the Society for Female Reproduction and Urology.  

## 2020-10-05 ENCOUNTER — Inpatient Hospital Stay: Admission: RE | Admit: 2020-10-05 | Payer: Medicaid Other | Source: Ambulatory Visit

## 2020-11-07 IMAGING — US US PELVIS COMPLETE TRANSABD/TRANSVAG
1 series · 15 of 25 positions shown · non-contrast
Comparison: None

CLINICAL DATA: Abnormal uterine bleeding



[Series 1: us pelvis complete transabd/transvag · 51 acquisitions, 15 frames shown]
[im 1/51]
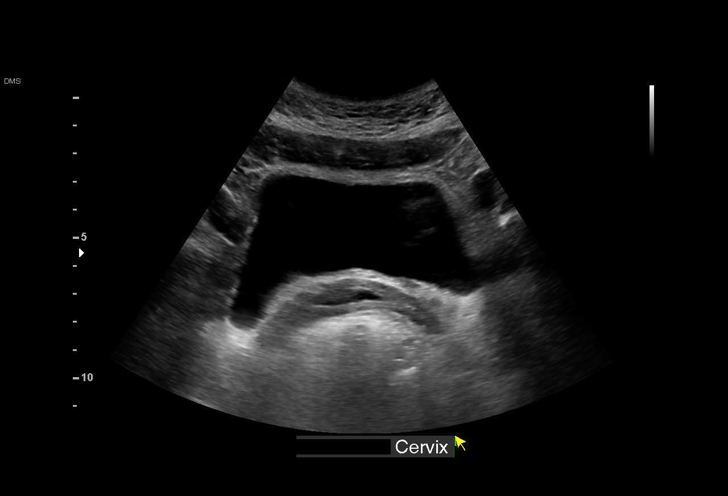
[im 5/51]
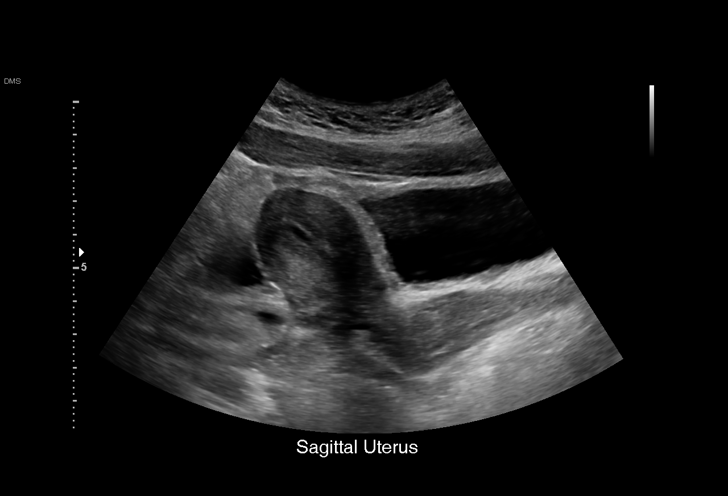
[im 9/51]
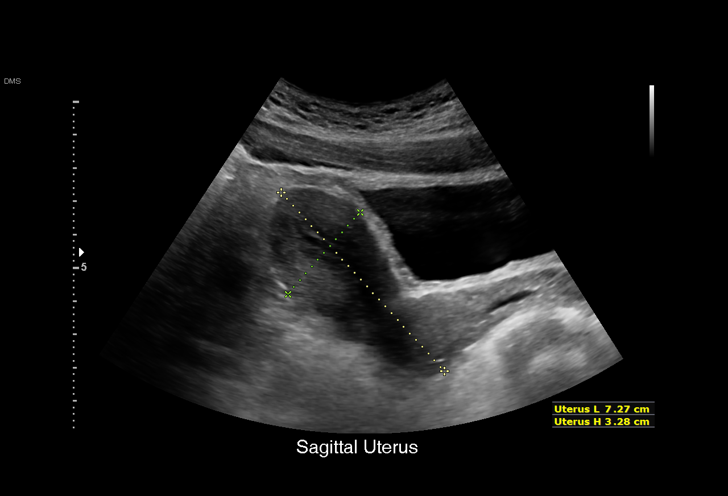
[im 11/51]
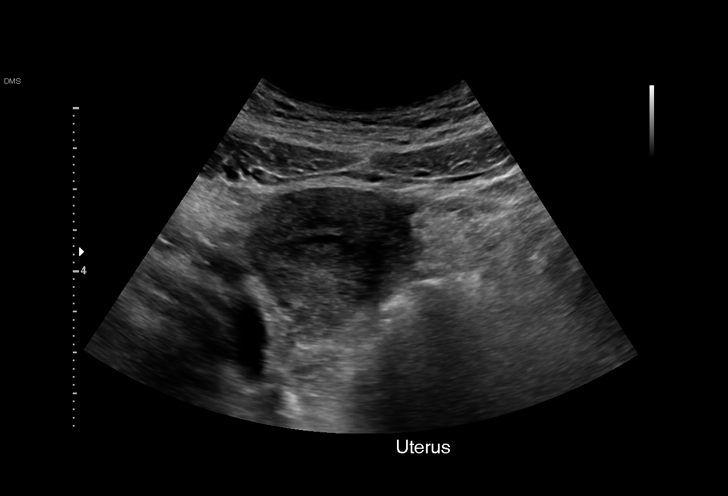
[im 15/51]
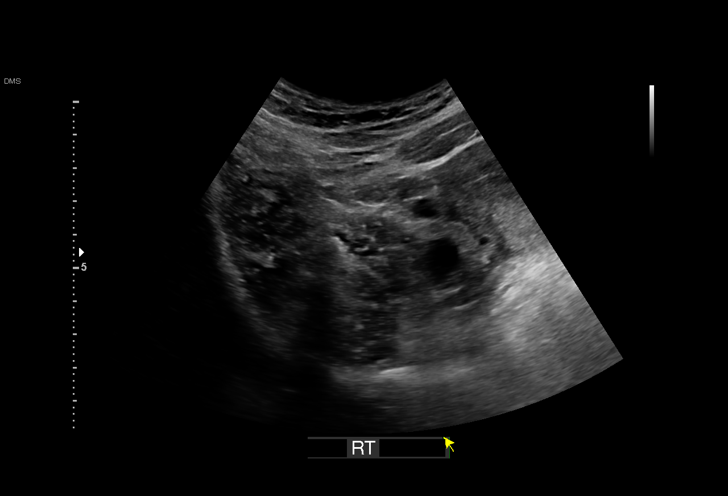
[im 19/51]
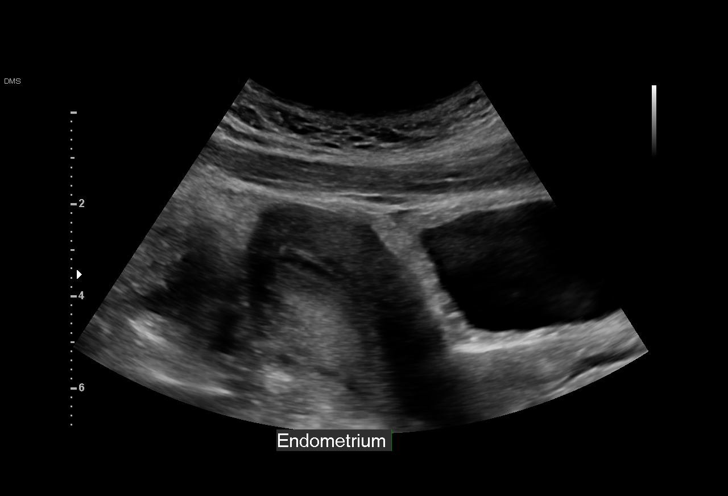
[im 21/51]
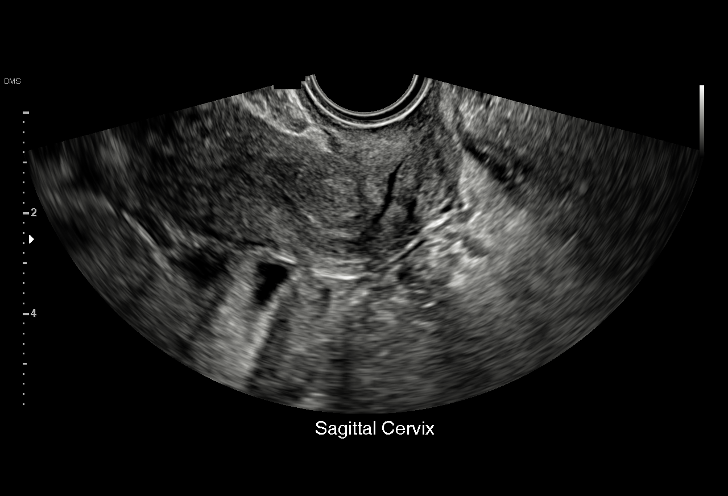
[im 26/51]
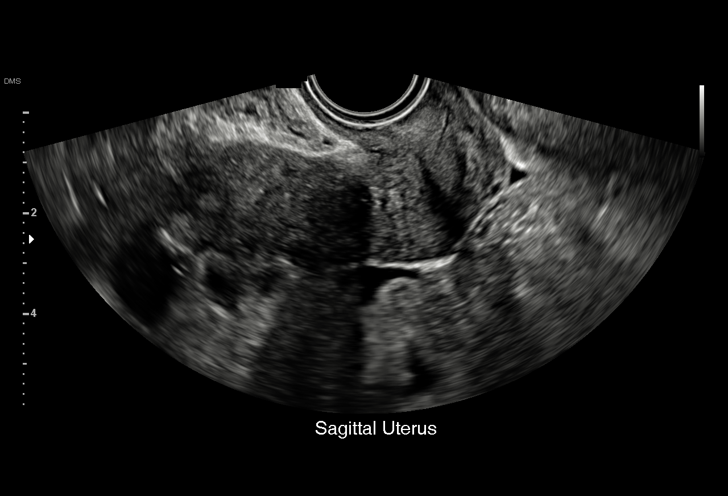
[im 30/51]
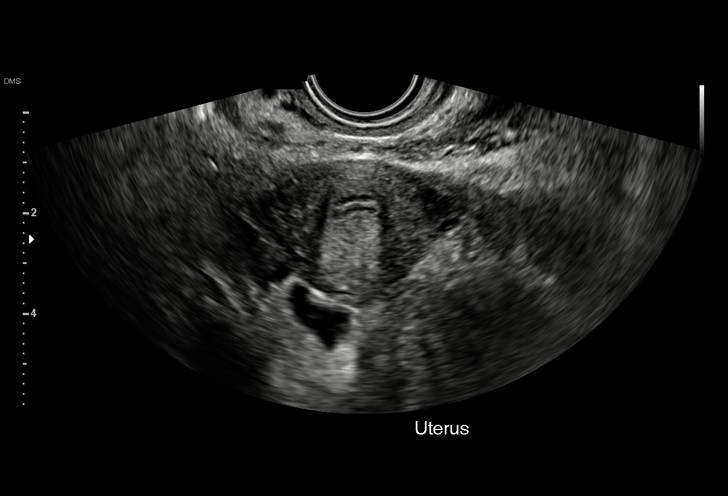
[im 32/51]
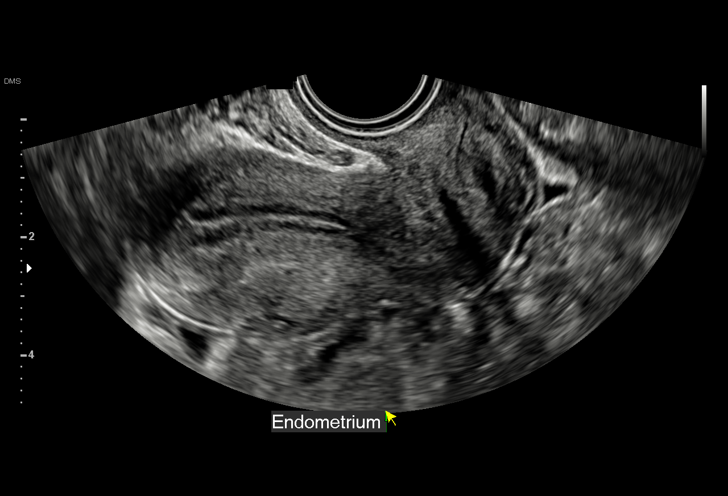
[im 36/51]
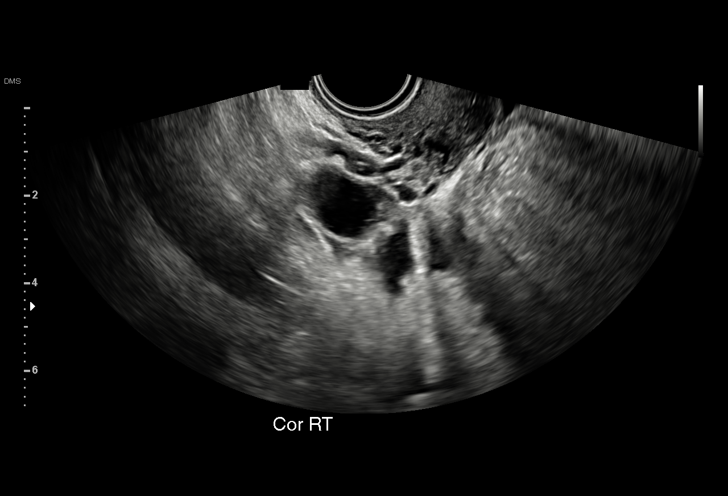
[im 40/51]
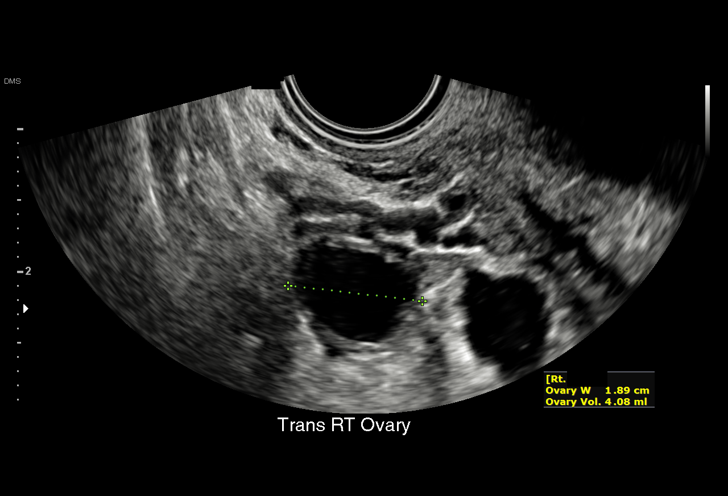
[im 42/51]
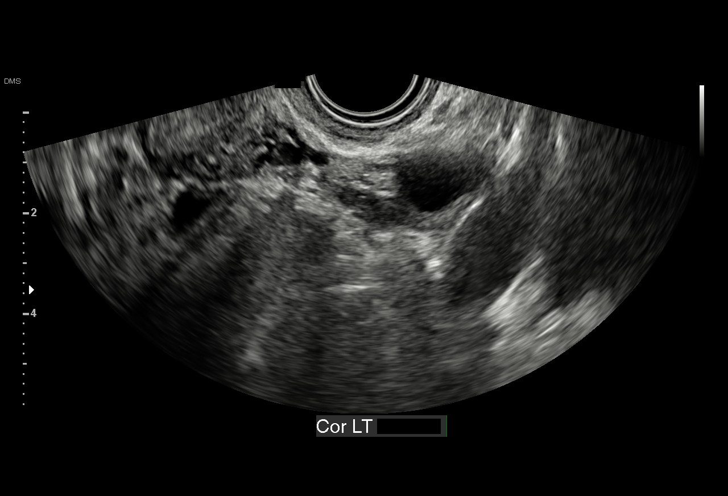
[im 46/51]
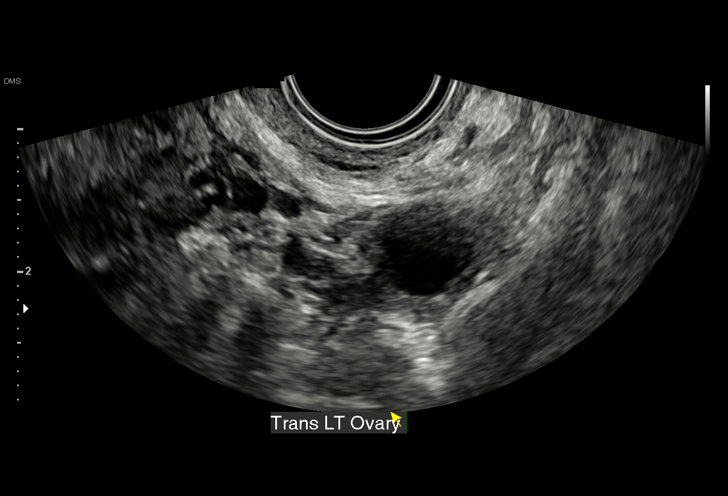
[im 51/51]
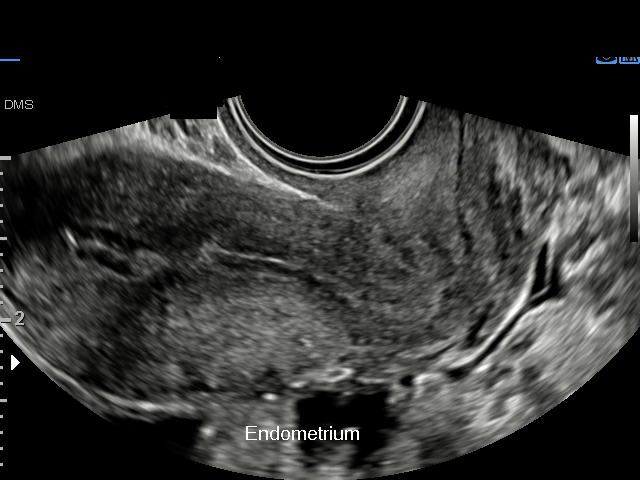

[15 of 25 positions shown; findings below may reference images not displayed]

FINDINGS: Uterus

Measurements: 7.3 x 3.3 x 4.4 cm = volume: 55 mL. No fibroids or
other mass visualized.

Endometrium

Thickness: 6 mm in thickness.  No focal abnormality visualized.

Right ovary

Measurements: 2.2 x 1.9 x 1.9 cm = volume: 4.1 mL. Normal
appearance/no adnexal mass. Dominant 1.6 cm follicle.

Left ovary

Measurements: 2.0 x 1.7 x 1.7 cm = volume: 3.1 mL. Normal
appearance/no adnexal mass. 1 cm follicle.

Other findings

Trace free fluid in the pelvis.
IMPRESSION: Normal pelvic ultrasound.

## 2020-11-28 ENCOUNTER — Other Ambulatory Visit: Payer: Self-pay | Admitting: Nurse Practitioner

## 2020-11-28 DIAGNOSIS — N6320 Unspecified lump in the left breast, unspecified quadrant: Secondary | ICD-10-CM

## 2021-05-15 ENCOUNTER — Other Ambulatory Visit: Payer: Self-pay

## 2021-05-15 ENCOUNTER — Ambulatory Visit (INDEPENDENT_AMBULATORY_CARE_PROVIDER_SITE_OTHER): Payer: Medicaid Other

## 2021-05-15 ENCOUNTER — Other Ambulatory Visit (HOSPITAL_COMMUNITY)
Admission: RE | Admit: 2021-05-15 | Discharge: 2021-05-15 | Disposition: A | Discharge status: Home / Self Care | Payer: Medicaid Other | Source: Ambulatory Visit | Attending: Obstetrics and Gynecology | Admitting: Obstetrics and Gynecology

## 2021-05-15 VITALS — BP 100/71 | HR 61

## 2021-05-15 DIAGNOSIS — A749 Chlamydial infection, unspecified: Secondary | ICD-10-CM

## 2021-05-15 DIAGNOSIS — Z113 Encounter for screening for infections with a predominantly sexual mode of transmission: Secondary | ICD-10-CM | POA: Insufficient documentation

## 2021-05-15 DIAGNOSIS — A549 Gonococcal infection, unspecified: Secondary | ICD-10-CM | POA: Insufficient documentation

## 2021-05-15 DIAGNOSIS — R300 Dysuria: Secondary | ICD-10-CM

## 2021-05-15 HISTORY — DX: Gonococcal infection, unspecified: A54.9

## 2021-05-15 HISTORY — DX: Chlamydial infection, unspecified: A74.9

## 2021-05-15 NOTE — Progress Notes (Signed)
Patient presents for STD testing. Patient states that she has a new partner and is concerned about exposure. She also reports having burning with urination. Urine culture sent.

## 2021-05-15 NOTE — Progress Notes (Signed)
Patient was assessed and managed by nursing staff during this encounter. I have reviewed the chart and agree with the documentation and plan. I have also made any necessary editorial changes.  Catalina Antigua, MD 05/15/2021 5:27 PM

## 2021-05-16 LAB — CERVICOVAGINAL ANCILLARY ONLY
Bacterial Vaginitis (gardnerella): POSITIVE — AB
Candida Glabrata: NEGATIVE
Candida Vaginitis: NEGATIVE
Chlamydia: POSITIVE — AB
Comment: NEGATIVE
Comment: NEGATIVE
Comment: NEGATIVE
Comment: NEGATIVE
Comment: NEGATIVE
Comment: NORMAL
Neisseria Gonorrhea: POSITIVE — AB
Trichomonas: NEGATIVE

## 2021-05-17 ENCOUNTER — Encounter: Payer: Self-pay | Admitting: Obstetrics & Gynecology

## 2021-05-17 ENCOUNTER — Other Ambulatory Visit: Payer: Self-pay | Admitting: Obstetrics & Gynecology

## 2021-05-17 ENCOUNTER — Other Ambulatory Visit: Payer: Self-pay

## 2021-05-17 DIAGNOSIS — B9689 Other specified bacterial agents as the cause of diseases classified elsewhere: Secondary | ICD-10-CM

## 2021-05-17 DIAGNOSIS — A749 Chlamydial infection, unspecified: Secondary | ICD-10-CM

## 2021-05-17 DIAGNOSIS — N760 Acute vaginitis: Secondary | ICD-10-CM

## 2021-05-17 MED ORDER — METRONIDAZOLE 500 MG PO TABS: 500.0000 mg | ORAL_TABLET | Freq: Two times a day (BID) | ORAL | 0 refills | Status: AC

## 2021-05-17 MED ORDER — DOXYCYCLINE HYCLATE 100 MG PO CAPS: 100.0000 mg | ORAL_CAPSULE | Freq: Two times a day (BID) | ORAL | 1 refills | Status: DC

## 2021-05-17 MED ORDER — METRONIDAZOLE 500 MG PO TABS: 500.0000 mg | ORAL_TABLET | Freq: Two times a day (BID) | ORAL | 0 refills | Status: DC

## 2021-05-17 MED ORDER — DOXYCYCLINE HYCLATE 100 MG PO CAPS: 100.0000 mg | ORAL_CAPSULE | Freq: Two times a day (BID) | ORAL | 1 refills | Status: AC

## 2021-05-17 NOTE — Progress Notes (Signed)
Patient has gonorrhea and chlamydia.  Recommend testing for other STIs, also needs to let partner(s) know so the partner(s) can get testing and treatment. Patient and sex partner(s) should abstain from unprotected sexual activity for seven days after everyone receives appropriate treatment.  Doxycycline was prescribed for patient for the chlamydia; also prescribed Metronidazole given her also being positive for bacterial vaginitis.  She needs to come fo office for Ceftriaxone injection as per protocol for gonorrhea.  Patient will need to return in about 4 weeks after treatment for repeat test of cure.  Please call to inform patient of results and recommendations, and advise to pick up prescription and take as directed.  Please advise patient to practice safe sex at all times.  Please emphasize to patient about importance of adherence to therapy as recommended. She can have the serum STI testing on the same day she comes in for the Ceftriaxone injection.  Jaynie Collins, MD

## 2021-05-19 ENCOUNTER — Ambulatory Visit (INDEPENDENT_AMBULATORY_CARE_PROVIDER_SITE_OTHER): Payer: Medicaid Other

## 2021-05-19 ENCOUNTER — Other Ambulatory Visit: Payer: Self-pay

## 2021-05-19 DIAGNOSIS — Z202 Contact with and (suspected) exposure to infections with a predominantly sexual mode of transmission: Secondary | ICD-10-CM

## 2021-05-19 DIAGNOSIS — A549 Gonococcal infection, unspecified: Secondary | ICD-10-CM | POA: Diagnosis not present

## 2021-05-19 MED ORDER — CEFTRIAXONE SODIUM 500 MG IJ SOLR
500.0000 mg | Freq: Once | INTRAMUSCULAR | Status: AC
  Administered 2021-05-19: 500 mg via INTRAMUSCULAR

## 2021-05-19 NOTE — Progress Notes (Signed)
Patient was assessed and managed by nursing staff during this encounter. I have reviewed the chart and agree with the documentation and plan. I have also made any necessary editorial changes.  Catalina Antigua, MD 05/19/2021 10:08 AM

## 2021-05-19 NOTE — Progress Notes (Signed)
Pt presents for Rocephin.  Rocephin 500 mg diluted with 1 ml Lidocaine 1% given given LUOQ without difficulty. During visit, pt requests STD bloodwork as well.

## 2021-05-20 LAB — URINE CULTURE

## 2021-05-20 LAB — RPR: RPR Ser Ql: NONREACTIVE

## 2021-05-20 LAB — HEPATITIS C ANTIBODY: Hep C Virus Ab: <0.1 s/co ratio (ref 0.0–0.9)

## 2021-05-20 LAB — HEPATITIS B SURFACE ANTIGEN: Hepatitis B Surface Ag: NEGATIVE

## 2021-05-20 LAB — HIV ANTIBODY (ROUTINE TESTING W REFLEX): HIV Screen 4th Generation wRfx: NONREACTIVE

## 2021-05-22 MED ORDER — CEPHALEXIN 500 MG PO CAPS: 500.0000 mg | ORAL_CAPSULE | Freq: Four times a day (QID) | ORAL | 2 refills | Status: AC

## 2021-05-22 NOTE — Addendum Note (Signed)
Addended by: Catalina Antigua on: 05/22/2021 10:37 AM   Modules accepted: Orders

## 2021-06-09 ENCOUNTER — Other Ambulatory Visit: Payer: Self-pay | Admitting: *Deleted

## 2021-06-09 MED ORDER — FLUCONAZOLE 150 MG PO TABS: 150.0000 mg | ORAL_TABLET | Freq: Once | ORAL | 0 refills | Status: AC

## 2021-06-09 NOTE — Progress Notes (Signed)
Pt called to office requesting yeast treatment after recent antibiotic use. Diflucan sent today per protocol.

## 2021-06-16 ENCOUNTER — Ambulatory Visit: Payer: Medicaid Other

## 2021-06-21 ENCOUNTER — Ambulatory Visit: Payer: Medicaid Other

## 2021-06-22 ENCOUNTER — Ambulatory Visit: Payer: Medicaid Other

## 2021-06-26 ENCOUNTER — Ambulatory Visit: Payer: Medicaid Other

## 2021-09-23 IMAGING — CR DG FOOT COMPLETE 3+V*R*
3 series · 3 of 3 positions shown · non-contrast
Comparison: None.

CLINICAL DATA: Right fourth toe pain after stubbing toe last night.
Persistent pain.

EXAM:
RIGHT FOOT COMPLETE - 3+ VIEW

[foot ap]
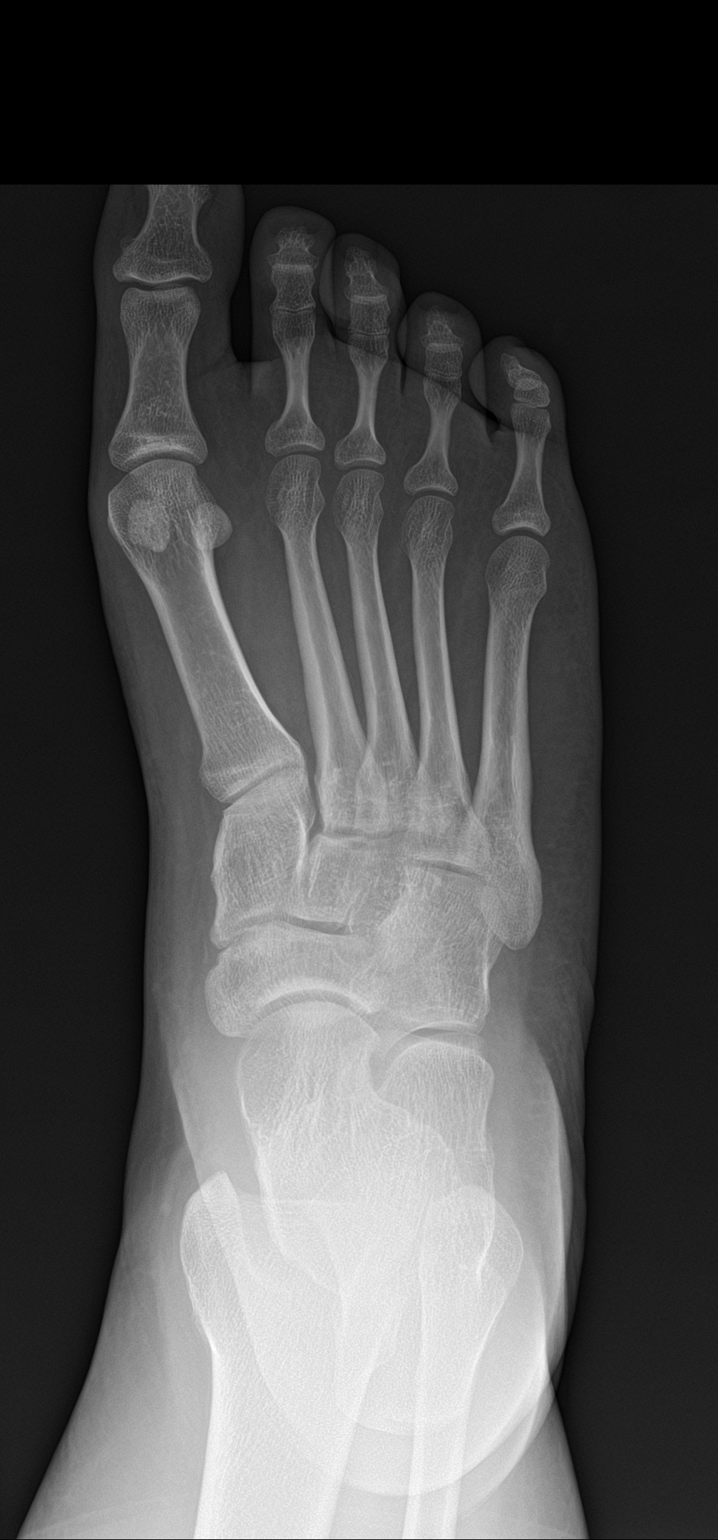

[foot obl]
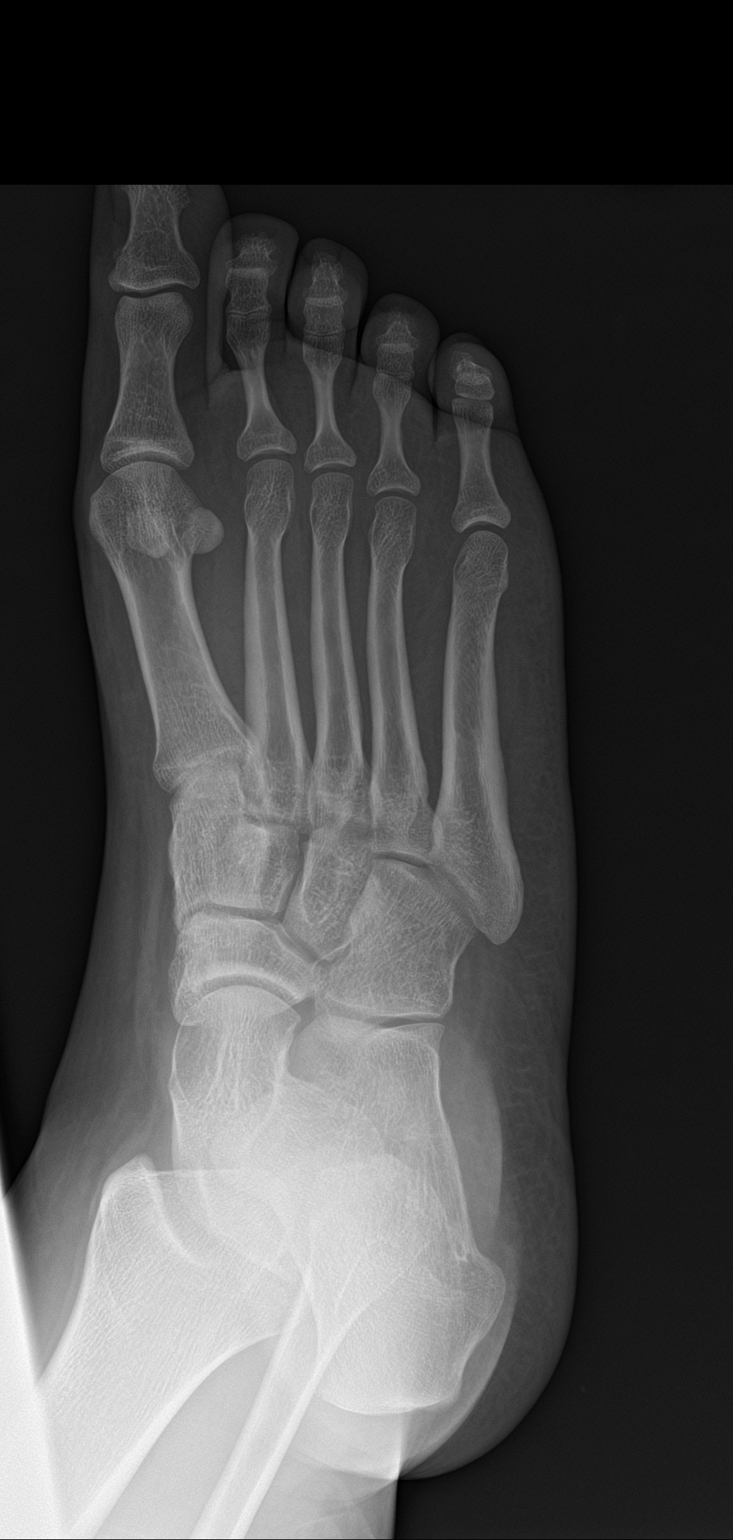

[foot lat]
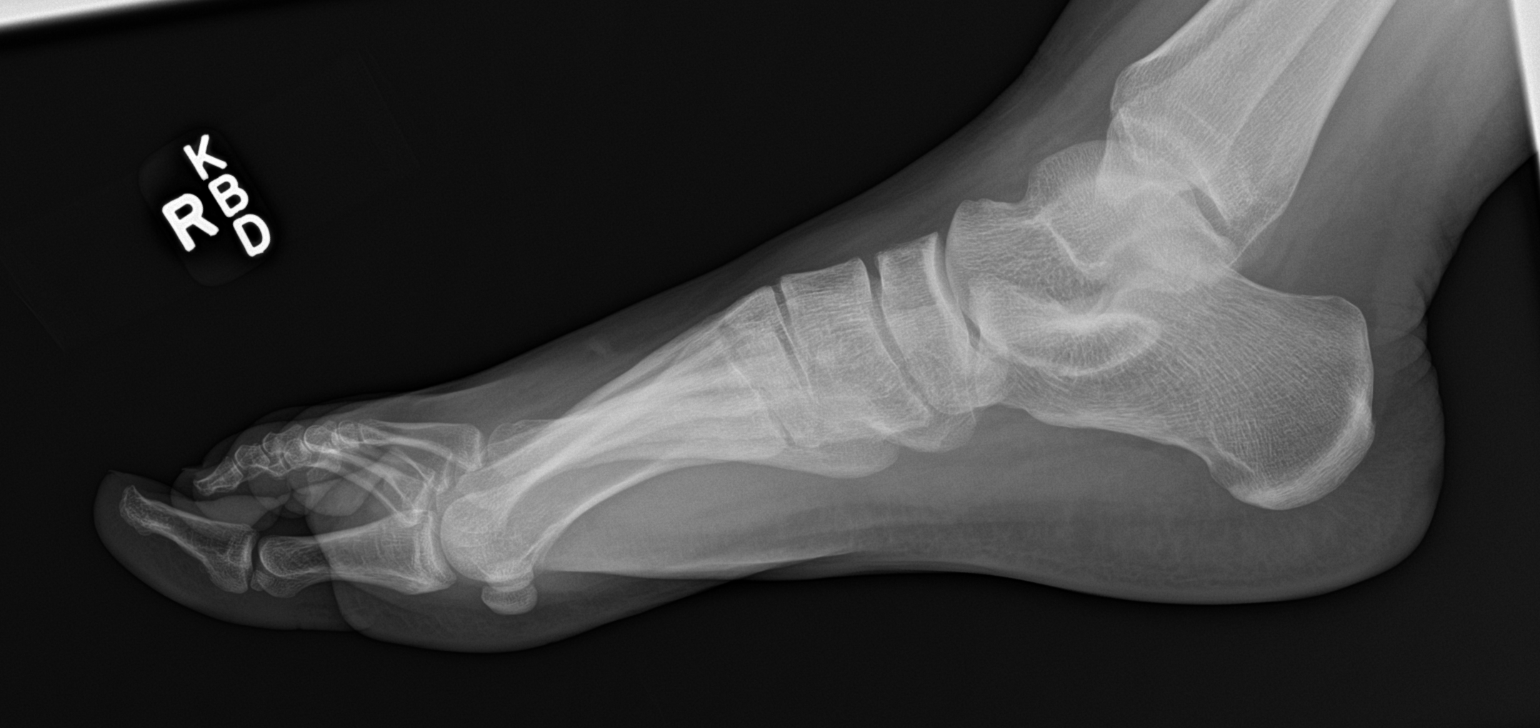

[3 of 3 positions shown; findings below may reference images not displayed]

FINDINGS: There is no evidence of fracture or dislocation. There is no
evidence of arthropathy or other focal bone abnormality. Soft
tissues are unremarkable.
IMPRESSION: Negative radiographs of the right foot.

## 2023-03-21 ENCOUNTER — Encounter (HOSPITAL_COMMUNITY): Payer: Self-pay | Admitting: Emergency Medicine

## 2023-03-21 ENCOUNTER — Other Ambulatory Visit: Payer: Self-pay

## 2023-03-21 ENCOUNTER — Emergency Department (HOSPITAL_COMMUNITY)
Admission: EM | Admit: 2023-03-21 | Discharge: 2023-03-21 | Disposition: A | Discharge status: Home / Self Care | Payer: Medicaid Other | Attending: Emergency Medicine | Admitting: Emergency Medicine

## 2023-03-21 DIAGNOSIS — Z20822 Contact with and (suspected) exposure to covid-19: Secondary | ICD-10-CM | POA: Diagnosis not present

## 2023-03-21 DIAGNOSIS — J020 Streptococcal pharyngitis: Secondary | ICD-10-CM | POA: Insufficient documentation

## 2023-03-21 DIAGNOSIS — R509 Fever, unspecified: Secondary | ICD-10-CM | POA: Diagnosis present

## 2023-03-21 LAB — GROUP A STREP BY PCR: Group A Strep by PCR: DETECTED — AB

## 2023-03-21 LAB — SARS CORONAVIRUS 2 BY RT PCR: SARS Coronavirus 2 by RT PCR: NEGATIVE

## 2023-03-21 MED ORDER — DEXAMETHASONE 4 MG PO TABS
10.0000 mg | ORAL_TABLET | Freq: Once | ORAL | Status: AC
  Administered 2023-03-21: 10 mg via ORAL
  Filled 2023-03-21: qty 3

## 2023-03-21 MED ORDER — AMOXICILLIN 250 MG/5ML PO SUSR: 500.0000 mg | Freq: Two times a day (BID) | ORAL | 0 refills | Status: AC

## 2023-03-21 NOTE — ED Triage Notes (Signed)
Pt presents with sore throat with fatigue, that started yesterday, took Motrin around 10 am today.

## 2023-03-21 NOTE — Discharge Instructions (Addendum)
You were seen in the ER today for a sore throat. Your COVID test was negative but you did test positive for strep. I have sent a prescription for amoxicillin to your pharmacy to take as prescribed for the next 10 days to treat this infection. If symptoms are worsening, please return to the ER or follow up with your primary care provider.

## 2023-03-21 NOTE — ED Provider Notes (Signed)
[place] EMERGENCY DEPARTMENT AT [facility] Provider Note   CSN: [id number] Arrival date & time: 03/21/23  1335     History Chief Complaint  Patient presents with   Sore Throat    Latoya West is a 22 y.o. female.  Patient presents to the emergency department complaints of sore throat.  She reports that symptoms began yesterday but not improved since then.  Reporting some difficulty swallowing due to pain.  Denies any cough, congestion, runny nose.  Endorses subjective fevers at home as well and has been taking Motrin to address pain and fever without significant improvement.  Denies any known or obvious sick contacts at home or at work.  Nothing specifically alleviates or worsens symptoms.   Sore Throat       Home Medications Prior to Admission medications   Medication Sig Start Date End Date Taking? Authorizing Provider  amoxicillin (AMOXIL) 250 MG/5ML suspension Take 10 mLs (500 mg total) by mouth 2 (two) times daily for 10 days. 03/21/23 03/31/23 Yes Smitty Knudsen, PA-C  cephALEXin (KEFLEX) 500 MG capsule Take 1 capsule (500 mg total) by mouth 4 (four) times daily. 05/22/21   Constant, Peggy, MD  EPINEPHrine 0.3 mg/0.3 mL IJ SOAJ injection Inject 0.3 mg into the muscle once as needed (for anaphylactic or severe allergic reaction).  Patient not taking: No sig reported    [provider]  Prenatal Vit-Fe Fumarate-FA (MULTIVITAMIN-PRENATAL) 27-0.8 MG TABS tablet Take 1 tablet by mouth daily at 12 noon.    [provider]      Allergies    Kiwi extract    Review of Systems   Review of Systems  HENT:  Positive for sore throat.   All other systems reviewed and are negative.   Physical Exam Updated Vital Signs BP (!) 111/58   Pulse 62   Temp 98.9 F (37.2 C) (Oral)   Resp 18   Ht 5' 6.5" (1.689 m)   Wt 81.6 kg   LMP 03/05/2023 (Approximate)   SpO2 100%   BMI 28.60 kg/m  Physical Exam Vitals and nursing note reviewed.   Constitutional:      General: She is not in acute distress.    Appearance: She is well-developed.  HENT:     Head: Normocephalic and atraumatic.     Mouth/Throat:     Mouth: Mucous membranes are moist. No oral lesions.     Pharynx: Uvula midline. Oropharyngeal exudate and posterior oropharyngeal erythema present. No pharyngeal swelling or uvula swelling.  Eyes:     Conjunctiva/sclera: Conjunctivae normal.  Cardiovascular:     Rate and Rhythm: Normal rate and regular rhythm.     Heart sounds: No murmur heard. Pulmonary:     Effort: Pulmonary effort is normal. No respiratory distress.     Breath sounds: Normal breath sounds.  Abdominal:     Palpations: Abdomen is soft.     Tenderness: There is no abdominal tenderness.  Musculoskeletal:        General: No swelling.     Cervical back: Neck supple.  Skin:    General: Skin is warm and dry.     Capillary Refill: Capillary refill takes less than 2 seconds.  Neurological:     Mental Status: She is alert.  Psychiatric:        Mood and Affect: Mood normal.     ED Results / Procedures / Treatments   Labs (all labs ordered are listed, but only abnormal results are displayed) Labs  Reviewed  GROUP A STREP BY PCR - Abnormal; Notable for the following components:      Result Value   Group A Strep by PCR DETECTED (*)    All other components within normal limits  SARS CORONAVIRUS 2 BY RT PCR    EKG None  Radiology No results found.  Procedures Procedures   Medications Ordered in ED Medications  dexamethasone (DECADRON) tablet 10 mg (10 mg Oral Given 03/21/23 1534)    ED Course/ Medical Decision Making/ A&P                           Medical Decision Making Risk Prescription drug management.   This patient presents to the ED for concern of sore throat.  Differential diagnosis includes strep pharyngitis, viral pharyngitis, peritonsillar abscess, retropharyngeal abscess, viral URI   Lab Tests:  I Ordered, and  personally interpreted labs.  The pertinent results include: COVID-19 negative, group A strep positive   Medicines ordered and prescription drug management:  I ordered medication including Decadron for sore throat  Reevaluation of the patient after these medicines showed that the patient stayed the same I have reviewed the patients home medicines and have made adjustments as needed   Problem List / ED Course:  Patient presented to the ED with concerns of a sore throat. Began yesterday and has not improved significantly since then. Denies any known sick contacts. Has not been treated or evaluated for this. Will evaluate with COVID-19 testing and Group A strep. COVID negative but strep is positive. Likely accounts for patient's symptoms. No signs of PTA or RPA. Will treated with antibiotics outpatient and a dose of Decadron here in the ED before discharge for symptomatic treatment of sore throat. Patient is agreeable with treatment plan and verbalized understanding all return precautions. Advised continued management of symptoms with OTC medications such as Tylenol, ibuprofen, or Aleve for fevers and pain as needed. Strict return precautions discussed. All questions answered prior to patient discharge.  Final Clinical Impression(s) / ED Diagnoses Final diagnoses:  Strep pharyngitis    Rx / DC Orders ED Discharge Orders          Ordered    amoxicillin (AMOXIL) 250 MG/5ML suspension  2 times daily        03/21/23 1548              Smitty Knudsen, PA-C 03/21/23 1550    Margarita Grizzle, MD 03/21/23 1600

## 2023-10-06 ENCOUNTER — Encounter (HOSPITAL_COMMUNITY): Payer: Self-pay | Admitting: *Deleted

## 2023-10-06 ENCOUNTER — Other Ambulatory Visit: Payer: Self-pay

## 2023-10-06 ENCOUNTER — Emergency Department (HOSPITAL_COMMUNITY)
Admission: EM | Admit: 2023-10-06 | Discharge: 2023-10-07 | Disposition: A | Discharge status: Home / Self Care | Payer: Medicaid Other | Attending: Emergency Medicine | Admitting: Emergency Medicine

## 2023-10-06 DIAGNOSIS — J101 Influenza due to other identified influenza virus with other respiratory manifestations: Secondary | ICD-10-CM | POA: Insufficient documentation

## 2023-10-06 DIAGNOSIS — R059 Cough, unspecified: Secondary | ICD-10-CM | POA: Diagnosis present

## 2023-10-06 DIAGNOSIS — Z20822 Contact with and (suspected) exposure to covid-19: Secondary | ICD-10-CM | POA: Diagnosis not present

## 2023-10-06 LAB — URINALYSIS, ROUTINE W REFLEX MICROSCOPIC
Bilirubin Urine: NEGATIVE
Glucose, UA: NEGATIVE mg/dL
Ketones, ur: NEGATIVE mg/dL
Leukocytes,Ua: NEGATIVE
Nitrite: NEGATIVE
Protein, ur: 30 mg/dL — AB
Specific Gravity, Urine: 1.024 (ref 1.005–1.030)
pH: 8 (ref 5.0–8.0)

## 2023-10-06 LAB — CBC
HCT: 38.6 % (ref 36.0–46.0)
Hemoglobin: 12.4 g/dL (ref 12.0–15.0)
MCH: 29 pg (ref 26.0–34.0)
MCHC: 32.1 g/dL (ref 30.0–36.0)
MCV: 90.2 fL (ref 80.0–100.0)
Platelets: 243 10*3/uL (ref 150–400)
RBC: 4.28 MIL/uL (ref 3.87–5.11)
RDW: 13 % (ref 11.5–15.5)
WBC: 5.6 10*3/uL (ref 4.0–10.5)
nRBC: 0 % (ref 0.0–0.2)

## 2023-10-06 LAB — COMPREHENSIVE METABOLIC PANEL
ALT: 10 U/L (ref 0–44)
AST: 12 U/L — ABNORMAL LOW (ref 15–41)
Albumin: 3.6 g/dL (ref 3.5–5.0)
Alkaline Phosphatase: 36 U/L — ABNORMAL LOW (ref 38–126)
Anion gap: 9 (ref 5–15)
BUN: 7 mg/dL (ref 6–20)
CO2: 21 mmol/L — ABNORMAL LOW (ref 22–32)
Calcium: 8.8 mg/dL — ABNORMAL LOW (ref 8.9–10.3)
Chloride: 105 mmol/L (ref 98–111)
Creatinine, Ser: 0.78 mg/dL (ref 0.44–1.00)
GFR, Estimated: >60 mL/min (ref >60)
Glucose, Bld: 97 mg/dL (ref 70–99)
Potassium: 3.5 mmol/L (ref 3.5–5.1)
Sodium: 135 mmol/L (ref 135–145)
Total Bilirubin: 0.5 mg/dL (ref 0.0–1.2)
Total Protein: 7 g/dL (ref 6.5–8.1)

## 2023-10-06 LAB — HCG, SERUM, QUALITATIVE: Preg, Serum: NEGATIVE

## 2023-10-06 MED ORDER — ACETAMINOPHEN 325 MG PO TABS
650.0000 mg | ORAL_TABLET | Freq: Once | ORAL | Status: AC
  Administered 2023-10-06: 650 mg via ORAL
  Filled 2023-10-06: qty 2

## 2023-10-06 NOTE — ED Triage Notes (Signed)
 The pt woke up today with a cough and feeling bad  she has not had any fever reducer lmp now

## 2023-10-07 LAB — RESP PANEL BY RT-PCR (RSV, FLU A&B, COVID)  RVPGX2
Influenza A by PCR: POSITIVE — AB
Influenza B by PCR: NEGATIVE
Resp Syncytial Virus by PCR: NEGATIVE
SARS Coronavirus 2 by RT PCR: NEGATIVE

## 2023-10-07 MED ORDER — IBUPROFEN 400 MG PO TABS
600.0000 mg | ORAL_TABLET | Freq: Once | ORAL | Status: AC
  Administered 2023-10-07: 600 mg via ORAL
  Filled 2023-10-07: qty 1

## 2023-10-07 NOTE — ED Provider Notes (Signed)
 [place] EMERGENCY DEPARTMENT AT [facility] Provider Note   CSN: [id number] Arrival date & time: 10/06/23  2209     History  Chief Complaint  Patient presents with   Cough    Latoya West is a 23 y.o. female who presents with concern for less than 24 hours of cough, body aches, "feeling poorly".  Patient works at [employer], has been exposed to flu patient clear.  Mucinex at home without improvement in cough.  Barky cough at time of my evaluation, no stridor.  No increased work of breathing.  No medications daily, no known medical diagnoses.  HPI     Home Medications Prior to Admission medications   Medication Sig Start Date End Date Taking? Authorizing Provider  cephALEXin  (KEFLEX ) 500 MG capsule Take 1 capsule (500 mg total) by mouth 4 (four) times daily. 05/22/21   Constant, Peggy, MD  EPINEPHrine 0.3 mg/0.3 mL IJ SOAJ injection Inject 0.3 mg into the muscle once as needed (for anaphylactic or severe allergic reaction).  Patient not taking: No sig reported    [provider]  Prenatal Vit-Fe Fumarate-FA (MULTIVITAMIN-PRENATAL) 27-0.8 MG TABS tablet Take 1 tablet by mouth daily at 12 noon.    [provider]      Allergies    Kiwi extract    Review of Systems   Review of Systems  Constitutional:  Positive for chills, fatigue and fever.  Respiratory:  Positive for cough.   Musculoskeletal:  Positive for myalgias.    Physical Exam Updated Vital Signs BP 115/71   Pulse 89   Temp 99 F (37.2 C)   Resp 16   Ht 5\' 6"  (1.676 m)   Wt 81.6 kg   LMP 10/06/2023   SpO2 99%   BMI 29.04 kg/m  Physical Exam Vitals and nursing note reviewed.  Constitutional:      Appearance: She is not ill-appearing or toxic-appearing.  HENT:     Head: Normocephalic and atraumatic.     Mouth/Throat:     Mouth: Mucous membranes are moist.     Pharynx: No oropharyngeal exudate or posterior oropharyngeal erythema.  Eyes:     General:         Right eye: No discharge.        Left eye: No discharge.     Conjunctiva/sclera: Conjunctivae normal.  Cardiovascular:     Rate and Rhythm: Normal rate and regular rhythm.     Pulses: Normal pulses.     Heart sounds: Normal heart sounds. No murmur heard. Pulmonary:     Effort: Pulmonary effort is normal. No respiratory distress.     Breath sounds: Normal breath sounds. No wheezing or rales.  Abdominal:     General: Bowel sounds are normal. There is no distension.     Palpations: Abdomen is soft.     Tenderness: There is no abdominal tenderness. There is no guarding or rebound.  Musculoskeletal:        General: No deformity.     Cervical back: Neck supple.  Skin:    General: Skin is warm and dry.     Capillary Refill: Capillary refill takes less than 2 seconds.  Neurological:     Mental Status: She is alert. Mental status is at baseline.  Psychiatric:        Mood and Affect: Mood normal.     ED Results / Procedures / Treatments   Labs (all labs ordered are listed, but only abnormal results are displayed)  Labs Reviewed  RESP PANEL BY RT-PCR (RSV, FLU A&B, COVID)  RVPGX2 - Abnormal; Notable for the following components:      Result Value   Influenza A by PCR POSITIVE (*)    All other components within normal limits  COMPREHENSIVE METABOLIC PANEL - Abnormal; Notable for the following components:   CO2 21 (*)    Calcium 8.8 (*)    AST 12 (*)    Alkaline Phosphatase 36 (*)    All other components within normal limits  URINALYSIS, ROUTINE W REFLEX MICROSCOPIC - Abnormal; Notable for the following components:   APPearance HAZY (*)    Hgb urine dipstick MODERATE (*)    Protein, ur 30 (*)    Bacteria, UA RARE (*)    All other components within normal limits  URINE CULTURE  CBC  HCG, SERUM, QUALITATIVE    EKG None  Radiology No results found.  Procedures Procedures    Medications Ordered in ED Medications  ibuprofen  (ADVIL ) tablet 600 mg (has no administration in  time range)  acetaminophen  (TYLENOL ) tablet 650 mg (650 mg Oral Given 10/06/23 2222)    ED Course/ Medical Decision Making/ A&P                                 Medical Decision Making 23 year old female with viral syndrome symptoms.  Febrile on intake vital signs otherwise normal.  Tylenol  administered treated with resolution of fever.  Cardiopulmonary and abdominal exams are benign.  Patient well-appearing.  Differential diagnosis for emergent cause of cough includes but is not limited to upper respiratory infection, lower respiratory infection, allergies, asthma, irritants, foreign body, medications such as ACE inhibitors, reflux, asthma, CHF, lung cancer, interstitial lung disease, psychiatric causes, postnasal drip and postinfectious bronchospasm.     Amount and/or Complexity of Data Reviewed Labs: ordered.    Details: CBC CMP unremarkable, pregnancy test negative, UA with blood in the urine, currently experiencing menstrual cycle.  RVP positive for influenza A.   Clinical picture consistent with acute viral etiology consistent with diagnosed influenza A today.  Recommend supportive care, concern for emergent underlying condition that would warrant further ED workup or inpatient management is exceedingly low.  Latoya West  voiced understanding of her medical evaluation and treatment plan. Each of their questions answered to their expressed satisfaction.  Return precautions were given.  Patient is well-appearing, stable, and was discharged in good condition.  This chart was dictated using voice recognition software, Dragon. Despite the best efforts of this provider to proofread and correct errors, errors may still occur which can change documentation meaning.         Final Clinical Impression(s) / ED Diagnoses Final diagnoses:  Influenza A    Rx / DC Orders ED Discharge Orders     None         Arlyne Lame 10/07/23 8295    Rosealee Concha, MD 10/07/23  [phone]

## 2023-10-07 NOTE — Discharge Instructions (Signed)
 You have the flu.  Use Tylenol  and ibuprofen  over-the-counter at home for management of your symptoms, continue to stay hydrated and follow-up with your primary care doctor.  Return to the ER with any new severe symptoms.

## 2023-10-08 LAB — URINE CULTURE

## 2024-06-15 NOTE — Progress Notes (Signed)
 Orthopaedic Surgery Follow-Up Visit 06/15/2024  HPI: 23 year old right-hand-dominant female presents today with complaint of right shoulder pain.  She reports that she first noticed the pain after her arm quickly went in a backwards motion while joking around with her boyfriend.  She reports this was on 06/06/2022.  Patient was seen at the ER where x-rays were taken.  She has been donning a sling since she reports that she did not feel a pop or click when this happened but her endorphins were up.  She states the pain is about a 2 out of 10 describing it as sharp stabbing and burning.  She reports that when she comes out of the sling she has decreased range of motion.  She reports nothing makes it better.  She reports she does vape.  She is not working at this time but is in school in the educational track.  She is in no previous injury to the right shoulder.  Pain is localized to the deltoid tuberosity.  Denies any numbness and tingling but she initially did when her accident happened.  Physical exam: Vitals:   06/15/24 1512  BP: 110/68  Pulse: 86  Weight: 82.1 kg (181 lb)  Height: 1.676 m (5' 5.98)     Appearance: healthy, no acute distress, well-groomed HEENT: EOMI, mucous membranes moist CV: RRR Pulm: breathing comfortably Right shoulder: No edema, erythema, ecchymosis no open wounds no abrasions.  Sensations intact superficial radial median ulnar nerve distribution as well as axillary nerve distribution.  Radial pulses 2+ abductor pollicis brevis 5\5 first dorsal interosseous 5\5 abductor digiti minimi 5\5.  Full active range of motion with wrist flexion and extension.  Active range of motion flexion 30 degrees abduction 30 degrees external rotation 0 internal rotation 60.  Passive range of motion flexion 90 degrees abduction 45 degrees external rotation 10 degrees and internal rotation 60 degrees.  Unable to form manual muscle testing secondary to range of motion.  Imaging:  Radiology  Results (last 14 days)     Procedure Component Value Units Date/Time   XR Humerus Right Min 2 Views [[id number]] Resulted: 06/15/24 1609   Order Status: Completed Updated: 06/15/24 1609   Narrative:     No acute fractures, dislocations, abnormal lesions      Reviewed radiographs of the right shoulder 05/31/2024 upon my interpretation demonstrate no acute fractures, dislocations, abnormal lesions.   Assessment/Plan: 1. Contusion of right shoulder, initial encounter  Ambulatory referral to Physical Therapy    2. Right arm pain  XR Humerus Right Min 2 Views   Ambulatory referral to Physical Therapy    3. Sprain of right shoulder, unspecified shoulder sprain type, initial encounter       Reviewed clinical exam, labs, radiographs ER notes and discussed treatment recommendations including NSAIDs, ice, passive range of motion for home exercises physical therapy wean from sling as tolerated patient is in agreement plan.  1) meloxicam 15 mg daily 2) ice right shoulder 20 minutes 3 times a day for 2 days 3) Home exercises for passive range of motion 4) physical therapy evaluation and treatment 5) wean from sling as tolerated 6) follow-up in 4 weeks for repeat exam   Latoya West. Anitra, MD MPT 06/15/2024 3:14 PM

## 2024-06-29 NOTE — Progress Notes (Signed)
 Physical Therapy Missed Visit Note  Payor: [place] [employer] / Plan: [place] [employer] / Product Type: Managed Medicaid /     The patient did not attend therapy appointment.  Reason:  No Show  Total Missed Visits:    Patient Contact:  No Contact with patient  Future Appointments:  Patient does not have additional appointments.  Action:  Initial evaluation, patient responsible for calling to reschedule.

## 2024-07-08 ENCOUNTER — Ambulatory Visit
